# Patient Record
Sex: Male | Born: 1975 | Race: White | Hispanic: No | Marital: Married
Health system: Midwestern US, Community
[De-identification: ages and names within clinical notes are randomized; demographics above are authoritative.]

## PROBLEM LIST (undated history)

## (undated) DIAGNOSIS — E119 Type 2 diabetes mellitus without complications: Secondary | ICD-10-CM

## (undated) DIAGNOSIS — I10 Essential (primary) hypertension: Secondary | ICD-10-CM

## (undated) DIAGNOSIS — E1140 Type 2 diabetes mellitus with diabetic neuropathy, unspecified: Secondary | ICD-10-CM

## (undated) HISTORY — PX: WISDOM TOOTH EXTRACTION: SHX21

---

## 2014-02-18 ENCOUNTER — Encounter: Attending: Internal Medicine | Primary: Family Medicine

## 2014-10-02 LAB — AMB EXT LDL-C: LDL-C, External: 50.2

## 2015-05-04 ENCOUNTER — Ambulatory Visit
Admit: 2015-05-04 | Discharge: 2015-05-04 | Payer: PRIVATE HEALTH INSURANCE | Attending: Family Medicine | Primary: Family Medicine

## 2015-05-04 DIAGNOSIS — I10 Essential (primary) hypertension: Secondary | ICD-10-CM

## 2015-05-04 MED ORDER — CODEINE-GUAIFENESIN 10 MG-100 MG/5 ML ORAL LIQUID
100-10 mg/5 mL | Freq: Four times a day (QID) | ORAL | 0 refills | Status: DC | PRN
Start: 2015-05-04 — End: 2015-05-17

## 2015-05-04 MED ORDER — ALBUTEROL SULFATE HFA 90 MCG/ACTUATION AEROSOL INHALER
90 mcg/actuation | RESPIRATORY_TRACT | 1 refills | Status: DC | PRN
Start: 2015-05-04 — End: 2015-05-17

## 2015-05-04 NOTE — Progress Notes (Signed)
HISTORY OF PRESENT ILLNESS  Jeffrey StaplerKevin D Bradley is a 40 y.o. male.  HPI Comments: went to ER over the weekend, bp was high- they gave him one month supply lisinopril     Hypertension    The history is provided by the patient and medical records. This is a new problem. Progression since onset: BP in ER was 180/110, improved slightly since starting meds last weekend. Risk factors include smoking/tobacco exposure, obesity, male gender, hypertension and a sedentary lifestyle.   Blood sugar problem   The history is provided by the patient and medical records. Chronicity: noticed elevated glucose on lab from a year ago > 200. pt checked random sugar on his dads monitor and was 140.  The symptoms are aggravated by eating. The treatment provided no relief.   Cough   The history is provided by the patient. Chronicity: seen in ER over weekend and started on zithromax. The problem has been gradually improving. The symptoms are relieved by medications. The treatment provided moderate relief.   Wheezing    The history is provided by the patient. This is a recurrent problem. The problem occurs rarely. Associated symptoms include cough (getting over bronchitis. ). The problem's precipitants include smoke. He has tried nothing for the symptoms.       Review of Systems   Constitutional: Negative.    HENT: Negative.    Eyes: Negative.    Respiratory: Positive for cough (getting over bronchitis. ) and wheezing.    Cardiovascular: Negative.    Gastrointestinal: Negative.    Genitourinary: Negative.    Musculoskeletal: Negative.    Skin: Negative.    Neurological: Negative.    Psychiatric/Behavioral: Negative.      Vitals:    05/04/15 1132 05/04/15 1135   BP: (!) 158/92 (!) 160/94   Pulse: 73    Resp: 17    Temp: 97.6 ??F (36.4 ??C)    TempSrc: Oral    SpO2: 99%    Weight: 274 lb (124.3 kg)    Height: 5' 11.5" (1.816 m)    Body mass index is 37.68 kg/(m^2).     Physical Exam    Constitutional: He is oriented to person, place, and time. Vital signs are normal. He appears well-developed and well-nourished.   obese   HENT:   Head: Normocephalic and atraumatic.   Right Ear: Tympanic membrane, external ear and ear canal normal.   Left Ear: Tympanic membrane, external ear and ear canal normal.   Nose: Nose normal.   Mouth/Throat: Uvula is midline, oropharynx is clear and moist and mucous membranes are normal.   Eyes: Conjunctivae and lids are normal. Pupils are equal, round, and reactive to light.   Neck: Trachea normal and normal range of motion. Neck supple. Normal carotid pulses present. Carotid bruit is not present. No tracheal deviation present. No thyroid mass and no thyromegaly present.   Cardiovascular: Normal rate, regular rhythm and normal heart sounds.    Pulmonary/Chest: Effort normal and breath sounds normal. No respiratory distress.   Musculoskeletal: Normal range of motion.   Lymphadenopathy:        Head (right side): No submandibular adenopathy present.        Head (left side): No submandibular adenopathy present.     He has no cervical adenopathy.        Right: No supraclavicular adenopathy present.        Left: No supraclavicular adenopathy present.   Neurological: He is alert and oriented to person, place, and time. He  has normal strength.   Skin: Skin is warm and dry.   Psychiatric: He has a normal mood and affect. His speech is normal and behavior is normal. Judgment and thought content normal. Cognition and memory are normal.       ASSESSMENT and PLAN    ICD-10-CM ICD-9-CM    1. HTN (hypertension), benign I10 401.1 lisinopril (PRINIVIL, ZESTRIL) 20 mg tablet      LIPID PANEL      METABOLIC PANEL, COMPREHENSIVE      TSH 3RD GENERATION      CBC WITH AUTOMATED DIFF   2. Glucose intolerance (impaired glucose tolerance) R73.02 790.22 HEMOGLOBIN A1C WITH EAG   3. Nicotine dependence, cigarettes, uncomplicated F17.210 305.1 PR SMOKING AND TOBACCO USE CESSATION 3 - 10 MINUTES    4. BMI 37.0-37.9, adult Z68.37 V85.37    5. Bronchitis J40 490 azithromycin (ZITHROMAX) 250 mg tablet      guaiFENesin-codeine (ROBITUSSIN AC) 100-10 mg/5 mL solution   6. Wheezing R06.2 786.07 REFERRAL TO PULMONARY DISEASE      albuterol (PROAIR HFA) 90 mcg/actuation inhaler         1. HTN (hypertension), benign  Jeffrey Bradley has been given the following recommendations today due to his elevated BP reading: rescreen BP within a minimum of 2 weeks, lifestyle modifications to include: weight reduction, DASH eating plan, dietary sodium restriction and increase physical activity and anti-hypertensive pharmacologic therapy ordered.  Continue Lisinopril at current dose. F/u 1-2 weeks to recheck and review labs.     - lisinopril (PRINIVIL, ZESTRIL) 20 mg tablet; Take  by mouth daily.  - LIPID PANEL  - METABOLIC PANEL, COMPREHENSIVE  - TSH, 3RD GENERATION  - CBC WITH AUTOMATED DIFF    2. Glucose intolerance (impaired glucose tolerance)  Elevated blood sugar is indicative of glucose intolerance.  Patient is educated regarding prediabetes pathophysiology.  Sugar and carbohydrate restriction discussed.  Recommended lean protein emphasis in diet, with elimination of  sugared beverages including soda pop and juices.  Decreased carbs such as pasta, rice, cereal, bread, potatoes emphasized.  Whole wheat and multigrain to replace all processed flour.  Weight loss and exercise also emphasized as ways to significantly reduce blood sugars.  Check A1C and f/u.    - HEMOGLOBIN A1C    3. Nicotine dependence, cigarettes, uncomplicated  The patient was counseled on the dangers of tobacco use, and was advised to quit.  Reviewed strategies to maximize success, including removing cigarettes and smoking materials from environment, stress management, substitution of other forms of reinforcement and support of family/friends.  Recommended that patient consider quitting tobacco and set a quit date.  Various methods to help quit discussed.  Benefits of quitting discussed.   3-10 minutes counsellng.    4. BMI 37.0-37.9, adult  I have reviewed/discussed the above normal BMI with the patient.  I have recommended the following interventions: dietary management education, guidance, and counseling, encourage exercise and monitor weight . .      5. Bronchitis  Finish abx. Cough suppressant prn. F/u prn.     - azithromycin (ZITHROMAX) 250 mg tablet; Take 250 mg by mouth daily.  - guaiFENesin-codeine (ROBITUSSIN AC) 100-10 mg/5 mL solution; Take 5 mL by mouth four (4) times daily as needed for Cough. Max Daily Amount: 20 mL.  Dispense: 240 mL; Refill: 0    6. Wheezing  Needs w/u for COPD vs asthma. Refer for PFTs.     - PFT (test only) - [facility]  - albuterol (PROAIR HFA)  90 mcg/actuation inhaler; Take 1 Puff by inhalation every four (4) hours as needed for Wheezing or Shortness of Breath.  Dispense: 1 Inhaler; Refill: 1      Follow-up Disposition:  Return in about 2 weeks (around 05/18/2015) for lab review, HTN.    Leamon Arnt., M.D.

## 2015-05-05 LAB — METABOLIC PANEL, COMPREHENSIVE
A-G Ratio: 1.8 (ref 1.2–2.2)
ALT (SGPT): 53 IU/L — ABNORMAL HIGH (ref 0–44)
AST (SGOT): 37 IU/L (ref 0–40)
Albumin: 4.8 g/dL (ref 3.5–5.5)
Alk. phosphatase: 77 IU/L (ref 39–117)
BUN/Creatinine ratio: 9 (ref 9–20)
BUN: 11 mg/dL (ref 6–20)
Bilirubin, total: 0.5 mg/dL (ref 0.0–1.2)
CO2: 24 mmol/L (ref 18–29)
Calcium: 9.9 mg/dL (ref 8.7–10.2)
Chloride: 100 mmol/L (ref 96–106)
Creatinine: 1.21 mg/dL (ref 0.76–1.27)
GFR est AA: 88 mL/min/{1.73_m2} (ref >59)
GFR est non-AA: 76 mL/min/{1.73_m2} (ref >59)
GLOBULIN, TOTAL: 2.6 g/dL (ref 1.5–4.5)
Glucose: 125 mg/dL — ABNORMAL HIGH (ref 65–99)
Potassium: 4.8 mmol/L (ref 3.5–5.2)
Protein, total: 7.4 g/dL (ref 6.0–8.5)
Sodium: 143 mmol/L (ref 134–144)

## 2015-05-05 LAB — CBC WITH AUTOMATED DIFF
ABS. BASOPHILS: 0.1 10*3/uL (ref 0.0–0.2)
ABS. EOSINOPHILS: 0.2 10*3/uL (ref 0.0–0.4)
ABS. IMM. GRANS.: 0 10*3/uL (ref 0.0–0.1)
ABS. MONOCYTES: 0.9 10*3/uL (ref 0.1–0.9)
ABS. NEUTROPHILS: 5.6 10*3/uL (ref 1.4–7.0)
Abs Lymphocytes: 3.3 10*3/uL — ABNORMAL HIGH (ref 0.7–3.1)
BASOPHILS: 1 %
EOSINOPHILS: 2 %
HCT: 47.5 % (ref 37.5–51.0)
HGB: 16 g/dL (ref 12.6–17.7)
IMMATURE GRANULOCYTES: 0 %
Lymphocytes: 32 %
MCH: 29.8 pg (ref 26.6–33.0)
MCHC: 33.7 g/dL (ref 31.5–35.7)
MCV: 89 fL (ref 79–97)
MONOCYTES: 9 %
NEUTROPHILS: 56 %
PLATELET: 259 10*3/uL (ref 150–379)
RBC: 5.37 x10E6/uL (ref 4.14–5.80)
RDW: 14.1 % (ref 12.3–15.4)
WBC: 10.1 10*3/uL (ref 3.4–10.8)

## 2015-05-05 LAB — HEMOGLOBIN A1C WITH EAG
Estimated average glucose: 146 mg/dL
Hemoglobin A1c: 6.7 % — ABNORMAL HIGH (ref 4.8–5.6)

## 2015-05-05 LAB — LIPID PANEL
Cholesterol, total: 142 mg/dL (ref 100–199)
HDL Cholesterol: 25 mg/dL — ABNORMAL LOW (ref >39)
LDL, calculated: 72 mg/dL (ref 0–99)
Triglyceride: 223 mg/dL — ABNORMAL HIGH (ref 0–149)
VLDL, calculated: 45 mg/dL — ABNORMAL HIGH (ref 5–40)

## 2015-05-05 LAB — TSH 3RD GENERATION: TSH: 1.71 u[IU]/mL (ref 0.450–4.500)

## 2015-05-17 ENCOUNTER — Ambulatory Visit
Admit: 2015-05-17 | Discharge: 2015-05-17 | Payer: PRIVATE HEALTH INSURANCE | Attending: Family Medicine | Primary: Family Medicine

## 2015-05-17 DIAGNOSIS — E1165 Type 2 diabetes mellitus with hyperglycemia: Secondary | ICD-10-CM

## 2015-05-17 MED ORDER — BLOOD SUGAR DIAGNOSTIC TEST STRIPS
ORAL_STRIP | 11 refills | Status: AC
Start: 2015-05-17 — End: ?

## 2015-05-17 MED ORDER — LANCETS
11 refills | Status: AC
Start: 2015-05-17 — End: ?

## 2015-05-17 MED ORDER — METFORMIN 500 MG TAB
500 mg | ORAL_TABLET | Freq: Two times a day (BID) | ORAL | 5 refills | Status: AC
Start: 2015-05-17 — End: ?

## 2015-05-17 MED ORDER — LISINOPRIL-HYDROCHLOROTHIAZIDE 20 MG-12.5 MG TAB
ORAL_TABLET | Freq: Every day | ORAL | 5 refills | Status: AC
Start: 2015-05-17 — End: ?

## 2015-05-17 MED ORDER — BLOOD GLUCOSE METER KIT
PACK | 0 refills | Status: AC
Start: 2015-05-17 — End: ?

## 2015-05-17 MED ORDER — TERBINAFINE 250 MG TAB
250 mg | ORAL_TABLET | Freq: Every day | ORAL | 2 refills | Status: AC
Start: 2015-05-17 — End: ?

## 2015-05-17 NOTE — Progress Notes (Signed)
HISTORY OF PRESENT ILLNESS  KEYSHUN ELPERS is a 40 y.o. male.  HPI Comments: lab review    Hypertension    The history is provided by the patient and medical records. This is a chronic problem. The problem has not changed since onset.Risk factors include dyslipidemia, diabetes mellitus and hypertension.   Nicotine Dependence   The history is provided by the patient. This is a chronic problem. The problem has not changed since onset.Nothing relieves the symptoms. The treatment provided no relief.   Diabetes   The history is provided by the patient and medical records. This is a new problem. Progression since onset: new diagnosis. The symptoms are aggravated by eating. Nothing relieves the symptoms. He has tried nothing for the symptoms. The treatment provided no relief.   Cholesterol Problem   The history is provided by the patient and medical records. This is a chronic problem. Associated symptoms comments: Normal LDL, low HDL, mildly elevated TG. The symptoms are aggravated by eating. He has tried nothing for the symptoms. The treatment provided no relief.     ========================================    05/04/15 Dr. Lenna Sciara    1. HTN (hypertension), benign  Si has been given the following recommendations today due to his elevated BP reading: rescreen BP within a minimum of 2 weeks, lifestyle modifications to include: weight reduction, DASH eating plan, dietary sodium restriction and increase physical activity and anti-hypertensive pharmacologic therapy ordered.  Continue Lisinopril at current dose. F/u 1-2 weeks to recheck and review labs.   ??  - lisinopril (PRINIVIL, ZESTRIL) 20 mg tablet; Take by mouth daily.  - LIPID PANEL  - METABOLIC PANEL, COMPREHENSIVE  - TSH, 3RD GENERATION  - CBC WITH AUTOMATED DIFF  ??  2. Glucose intolerance (impaired glucose tolerance)  Elevated blood sugar is indicative of glucose intolerance. Patient is educated regarding prediabetes pathophysiology. Sugar and carbohydrate  restriction discussed. Recommended lean protein emphasis in diet, with elimination of sugared beverages including soda pop and juices. Decreased carbs such as pasta, rice, cereal, bread, potatoes emphasized. Whole wheat and multigrain to replace all processed flour. Weight loss and exercise also emphasized as ways to significantly reduce blood sugars. Check A1C and f/u.  ??  - HEMOGLOBIN A1C  ??  3. Nicotine dependence, cigarettes, uncomplicated  The patient was counseled on the dangers of tobacco use, and was advised to quit. Reviewed strategies to maximize success, including removing cigarettes and smoking materials from environment, stress management, substitution of other forms of reinforcement and support of family/friends.  Recommended that patient consider quitting tobacco and set a quit date. Various methods to help quit discussed. Benefits of quitting discussed.   3-10 minutes counsellng.  ??  4. BMI 37.0-37.9, adult  I have reviewed/discussed the above normal BMI with the patient. I have recommended the following interventions: dietary management education, guidance, and counseling, encourage exercise and monitor weight . Marland Kitchen   ??  5. Bronchitis  Finish abx. Cough suppressant prn. F/u prn.   ??  - azithromycin (ZITHROMAX) 250 mg tablet; Take 250 mg by mouth daily.  - guaiFENesin-codeine (ROBITUSSIN AC) 100-10 mg/5 mL solution; Take 5 mL by mouth four (4) times daily as needed for Cough. Max Daily Amount: 20 mL. Dispense: 240 mL; Refill: 0  ??  6. Wheezing  Needs w/u for COPD vs asthma. Refer for PFTs.    - PFT (test only) - [facility]  - albuterol (PROAIR HFA) 90 mcg/actuation inhaler; Take 1 Puff by inhalation every four (4) hours as  needed for Wheezing or Shortness of Breath. Dispense: 1 Inhaler; Refill: 1  ??  ??  Follow-up Disposition:  Return in about 2 weeks (around 05/18/2015) for lab review, HTN.    ==========================================================  Review of Systems    Constitutional: Negative.    HENT: Negative.    Eyes: Negative.    Respiratory: Negative.    Cardiovascular: Negative.    Gastrointestinal: Negative.    Genitourinary: Negative.    Musculoskeletal: Negative.    Skin: Negative.         Fungal toenails     Neurological: Negative.    Psychiatric/Behavioral: Negative.        Visit Vitals   ??? BP 144/90 (BP 1 Location: Left arm)   ??? Pulse 79   ??? Temp 97.9 ??F (36.6 ??C) (Oral)   ??? Resp 16   ??? Ht 5' 11.5" (1.816 m)   ??? Wt 244 lb (110.7 kg)   ??? SpO2 98%   ??? BMI 33.56 kg/m2       Physical Exam   Constitutional: He is oriented to person, place, and time. Vital signs are normal. He appears well-developed and well-nourished. No distress.   obese   HENT:   Head: Normocephalic and atraumatic.   Right Ear: External ear normal.   Left Ear: External ear normal.   Nose: Nose normal.   Eyes: Conjunctivae and lids are normal.   Neck: Trachea normal and normal range of motion. Neck supple. No thyromegaly present.   Cardiovascular: Normal rate and regular rhythm.    Pulmonary/Chest: Effort normal. No respiratory distress.   Musculoskeletal: Normal range of motion.   Neurological: He is alert and oriented to person, place, and time. He has normal strength. Gait normal.   Monofilament testing bilateral feet:normal sensation soles of feet. Mild dry skin and callus bilateral soles. Several nails are discolored, overgrown, and thickened.        Skin: Skin is warm and dry.   Psychiatric: He has a normal mood and affect. His behavior is normal. Judgment and thought content normal.     Component      Latest Ref Rng & Units 05/04/2015          12:44 PM   WBC      3.4 - 10.8 x10E3/uL 10.1   RBC      4.14 - 5.80 x10E6/uL 5.37   HGB      12.6 - 17.7 g/dL 16.0   HCT      37.5 - 51.0 % 47.5   MCV      79 - 97 fL 89   MCH      26.6 - 33.0 pg 29.8   MCHC      31.5 - 35.7 g/dL 33.7   RDW      12.3 - 15.4 % 14.1   PLATELET      150 - 379 x10E3/uL 259   NEUTROPHILS      % 56   Lymphocytes      % 32    MONOCYTES      % 9   EOSINOPHILS      % 2   BASOPHILS      % 1     Component      Latest Ref Rng & Units 05/04/2015          12:44 PM   Glucose      65 - 99 mg/dL 125 (H)   BUN      6 - 20  mg/dL 11   Creatinine      0.76 - 1.27 mg/dL 1.21   GFR est non-AA      >59 mL/min/1.73 76   GFR est AA      >59 mL/min/1.73 88   BUN/Creatinine ratio      9 - 20 9   Sodium      134 - 144 mmol/L 143   Potassium      3.5 - 5.2 mmol/L 4.8   Chloride      96 - 106 mmol/L 100   CO2      18 - 29 mmol/L 24   Calcium      8.7 - 10.2 mg/dL 9.9   Protein, total      6.0 - 8.5 g/dL 7.4   Albumin      3.5 - 5.5 g/dL 4.8   GLOBULIN, TOTAL      1.5 - 4.5 g/dL 2.6   A-G Ratio      1.2 - 2.2 1.8   Bilirubin, total      0.0 - 1.2 mg/dL 0.5   Alk. phosphatase      39 - 117 IU/L 77   AST      0 - 40 IU/L 37   ALT      0 - 44 IU/L 53 (H)     Component      Latest Ref Rng & Units 05/04/2015          12:44 PM   Cholesterol, total      100 - 199 mg/dL 142   Triglyceride      0 - 149 mg/dL 223 (H)   HDL Cholesterol      >39 mg/dL 25 (L)   VLDL, calculated      5 - 40 mg/dL 45 (H)   LDL, calculated      0 - 99 mg/dL 72     Component      Latest Ref Rng & Units 05/04/2015          12:44 PM   Hemoglobin A1c, (calculated)      4.8 - 5.6 % 6.7 (H)   Estimated average glucose      mg/dL 146     Component      Latest Ref Rng & Units 05/04/2015          12:44 PM   TSH      0.450 - 4.500 uIU/mL 1.710       ASSESSMENT and PLAN    ICD-10-CM ICD-9-CM    1. Type 2 diabetes mellitus with hyperglycemia, without long-term current use of insulin (HCC) E11.65 250.00 metFORMIN (GLUCOPHAGE) 500 mg tablet     790.29 Blood-Glucose Meter monitoring kit      Lancets (ACCU-CHEK SOFTCLIX LANCETS) misc      glucose blood VI test strips (ACCU-CHEK AVIVA PLUS TEST STRP) strip      REFERRAL TO DIABETIC EDUCATION      HEMOGLOBIN A1C WITH EAG      MICROALBUMIN, UR, RAND W/ MICROALBUMIN/CREA RATIO   2. HTN (hypertension), benign I10 401.1 lisinopril-hydroCHLOROthiazide  (PRINZIDE, ZESTORETIC) 20-12.5 mg per tablet      METABOLIC PANEL, COMPREHENSIVE   3. Mixed hyperlipidemia E78.2 272.2 LIPID PANEL   4. BMI 37.0-37.9, adult Z68.37 V85.37    5. Nicotine dependence, cigarettes, uncomplicated O13.086 578.4 PR SMOKING AND TOBACCO USE CESSATION 3 - 10 MINUTES   6. Onychomycosis B35.1 110.1 terbinafine HCl (LAMISIL) 250 mg tablet  1. Type 2 diabetes mellitus with hyperglycemia, without long-term current use of insulin (HCC)  New dx DM2. Basic discussion of diet, lifestyle, weight loss, exercise for diabetes.  Specifically recommended a low carbohydrate diet with a mean protein emphasized.  For every 10 pounds patient's lose, and for every 30 min. Of daily exercise, diabetes management can be significantly improved with decreasing need for medication. Will refer to diabetes education. Diabetes testing supplies ordered. Medication will be started: Metformin. Discussed indications, benefits, risks, side effects, and alternatives to medication. F/u 3 mo.    - metFORMIN (GLUCOPHAGE) 500 mg tablet; Take 1 Tab by mouth two (2) times daily (with meals). Indications: type 2 diabetes mellitus  Dispense: 31 Tab; Refill: 5  - Blood-Glucose Meter monitoring kit; Accucheck. Use to check glucose. Check blood sugars BID. E11.9  Dispense: 1 Kit; Refill: 0  - Lancets (ACCU-CHEK SOFTCLIX LANCETS) misc; Check blood sugars BID. E11.9  Dispense: 1 Each; Refill: 11  - glucose blood VI test strips (ACCU-CHEK AVIVA PLUS TEST STRP) strip; Check blood sugars BID. E11.9  Dispense: 100 Strip; Refill: 11  - REFERRAL TO DIABETIC EDUCATION  - HEMOGLOBIN A1C; Future  - MICROALBUMIN, UR, RAND W/ MICROALBUMIN/CREA RATIO; Future    2. HTN (hypertension), benign  Joles has been given the following recommendations today due to his elevated BP reading: rescreen BP within a minimum of 3 months and lifestyle modifications to include: weight reduction, DASH eating plan,  dietary sodium restriction and increase physical activity.  Add HCTZ to prinivil, change to Prinzide. Recheck BP 3 mo.    - lisinopril-hydroCHLOROthiazide (PRINZIDE, ZESTORETIC) 20-12.5 mg per tablet; Take 1 Tab by mouth daily.  Dispense: 30 Tab; Refill: 5  - METABOLIC PANEL, COMPREHENSIVE; Future    3. Mixed hyperlipidemia  HDL low and TG elevated. LDL normal. Recheck 3 mo.     - LIPID PANEL; Future    4. BMI 37.0-37.9, adult  I have reviewed/discussed the above normal BMI with the patient.  I have recommended the following interventions: dietary management education, guidance, and counseling, encourage exercise and monitor weight . .      5. Nicotine dependence, cigarettes, uncomplicated  Recommended that patient consider quitting tobacco and set a quit date. Various methods to help quit discussed.  Benefits of quitting discussed.   3-10 minutes counsellng.  The patient was counseled on the dangers of tobacco use, and was advised to quit.  Reviewed strategies to maximize success, including removing cigarettes and smoking materials from environment and substitution of other forms of reinforcement.    - PR SMOKING AND TOBACCO USE CESSATION 3 - 10 MINUTES    6. Onychomycosis  Onychomycosis is a fungus that grows in the nail bed.  It does not respond to topical medications.  He can only be cured by 3 months of an oral antifungal.  Risks of this medication including liver toxicity.  Therefore liver enzymes are measured prior to starting and at end of taking this medication.  Care rates are approximately 80% at one year, with recurrence of 20% over the next 2-3 years.  Patient should not expect to see normal nails for 9-12 months after initiating therapy.  After considering this information the patient chooses to treat with Lamisil.    - terbinafine HCl (LAMISIL) 250 mg tablet; Take 1 Tab by mouth daily. 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    Eileen Stanford., M.D.

## 2015-05-17 NOTE — Patient Instructions (Signed)
Diabetes Foot Health: Care Instructions  Your Care Instructions    When you have diabetes, your feet need extra care and attention. Diabetes can damage the nerve endings and blood vessels in your feet, making you less likely to notice when your feet are injured. Diabetes also limits your body's ability to fight infection and get blood to areas that need it. If you get a minor foot injury, it could become an ulcer or a serious infection. With good foot care, you can prevent most of these problems.  Caring for your feet can be quick and easy. Most of the care can be done when you are bathing or getting ready for bed.  Follow-up care is a key part of your treatment and safety. Be sure to make and go to all appointments, and call your doctor if you are having problems. It???s also a good idea to know your test results and keep a list of the medicines you take.  How can you care for yourself at home?  ?? Keep your blood sugar close to normal by watching what and how much you eat, monitoring blood sugar, taking medicines if prescribed, and getting regular exercise.  ?? Do not smoke. Smoking affects blood flow and can make foot problems worse. If you need help quitting, talk to your doctor about stop-smoking programs and medicines. These can increase your chances of quitting for good.  ?? Eat a diet that is low in fats. High fat intake can cause fat to build up in your blood vessels and decrease blood flow.  ?? Inspect your feet daily for blisters, cuts, cracks, or sores. If you cannot see well, use a mirror or have someone help you.  ?? Take care of your feet:  ?? Wash your feet every day. Use warm (not hot) water. Check the water temperature with your wrists or other part of your body, not your feet.  ?? Dry your feet well. Pat them dry. Do not rub the skin on your feet too hard. Dry well between your toes. If the skin on your feet stays moist, bacteria or a fungus can grow, which can lead to infection.   ?? Keep your skin soft. Use moisturizing skin cream to keep the skin on your feet soft and prevent calluses and cracks. But do not put the cream between your toes, and stop using any cream that causes a rash.  ?? Clean underneath your toenails carefully. Do not use a sharp object to clean underneath your toenails. Use the blunt end of a nail file or other rounded tool.  ?? Trim and file your toenails straight across to prevent ingrown toenails. Use a nail clipper, not scissors. Use an emery board to smooth the edges.  ?? Change socks daily. Socks without seams are best, because seams often rub the feet. You can find socks for people with diabetes from specialty catalogs.  ?? Look inside your shoes every day for things like gravel or torn linings, which could cause blisters or sores.  ?? Buy shoes that fit well:  ?? Look for shoes that have plenty of space around the toes. This helps prevent bunions and blisters.  ?? Try on shoes while wearing the kind of socks you will usually wear with the shoes.  ?? Avoid plastic shoes. They may rub your feet and cause blisters. Good shoes should be made of materials that are flexible and breathable, such as leather or cloth.  ?? Break in new shoes slowly by wearing   them for no more than an hour a day for several days. Take extra time to check your feet for red areas, blisters, or other problems after you wear new shoes.  ?? Do not go barefoot. Do not wear sandals, and do not wear shoes with very thin soles. Thin soles are easy to puncture. They also do not protect your feet from hot pavement or cold weather.  ?? Have your doctor check your feet during each visit. If you have a foot problem, see your doctor. Do not try to treat an early foot problem at home. Home remedies or treatments that you can buy without a prescription (such as corn removers) can be harmful.  ?? Always get early treatment for foot problems. A minor irritation can lead to a major problem if not properly cared for early.   When should you call for help?  Call your doctor now or seek immediate medical care if:  ?? You have a foot sore, an ulcer or break in the skin that is not healing after 4 days, bleeding corns or calluses, or an ingrown toenail.  ?? You have blue or black areas, which can mean bruising or blood flow problems.  ?? You have peeling skin or tiny blisters between your toes or cracking or oozing of the skin.  ?? You have a fever for more than 24 hours and a foot sore.  ?? You have new numbness or tingling in your feet that does not go away after you move your feet or change positions.  ?? You have unexplained or unusual swelling of the foot or ankle.  Watch closely for changes in your health, and be sure to contact your doctor if:  ?? You cannot do proper foot care.  Where can you learn more?  Go to http://www.healthwise.net/[employer].  Enter A739 in the search box to learn more about "Diabetes Foot Health: Care Instructions."  Current as of: May 25, 2014  Content Version: 11.2  ?? 2006-2017 [employer]. Care instructions adapted under license by [employer] (which disclaims liability or warranty for this information). If you have questions about a medical condition or this instruction, always ask your healthcare professional. [employer] disclaims any warranty or liability for your use of this information.       Learning About Diabetes Food Guidelines  Your Care Instructions  Meal planning is important to manage diabetes. It helps keep your blood sugar at a target level (which you set with your doctor). You don't have to eat special foods. You can eat what your family eats, including sweets once in a while. But you do have to pay attention to how often you eat and how much you eat of certain foods.  You may want to work with a dietitian or a certified diabetes educator (CDE) to help you plan meals and snacks. A dietitian or CDE can also help  you lose weight if that is one of your goals.  What should you know about eating carbs?  Managing the amount of carbohydrate (carbs) you eat is an important part of healthy meals when you have diabetes. Carbohydrate is found in many foods.  ?? Learn which foods have carbs. And learn the amounts of carbs in different foods.  ?? Bread, cereal, pasta, and rice have about 15 grams of carbs in a serving. A serving is 1 slice of bread (1 ounce), ?? cup of cooked cereal, or 1/3 cup of cooked pasta or rice.  ?? Fruits have   15 grams of carbs in a serving. A serving is 1 small fresh fruit, such as an apple or orange; ?? of a banana; ?? cup of cooked or canned fruit; ?? cup of fruit juice; 1 cup of melon or raspberries; or 2 tablespoons of dried fruit.  ?? Milk and no-sugar-added yogurt have 15 grams of carbs in a serving. A serving is 1 cup of milk or 2/3 cup of no-sugar-added yogurt.  ?? Starchy vegetables have 15 grams of carbs in a serving. A serving is ?? cup of mashed potatoes or sweet potato; 1 cup winter squash; ?? of a small baked potato; ?? cup of cooked beans; or ?? cup cooked corn or green peas.  ?? Learn how much carbs to eat each day and at each meal. A dietitian or CDE can teach you how to keep track of the amount of carbs you eat. This is called carbohydrate counting.  ?? If you are not sure how to count carbohydrate grams, use the Plate Method to plan meals. It is a good, quick way to make sure that you have a balanced meal. It also helps you spread carbs throughout the day.  ?? Divide your plate by types of foods. Put non-starchy vegetables on half the plate, meat or other protein food on one-quarter of the plate, and a grain or starchy vegetable in the final quarter of the plate. To this you can add a small piece of fruit and 1 cup of milk or yogurt, depending on how many carbs you are supposed to eat at a meal.  ?? Try to eat about the same amount of carbs at each meal. Do not "save up"  your daily allowance of carbs to eat at one meal.  ?? Proteins have very little or no carbs per serving. Examples of proteins are beef, chicken, turkey, fish, eggs, tofu, cheese, cottage cheese, and peanut butter. A serving size of meat is 3 ounces, which is about the size of a deck of cards. Examples of meat substitute serving sizes (equal to 1 ounce of meat) are 1/4 cup of cottage cheese, 1 egg, 1 tablespoon of peanut butter, and ?? cup of tofu.  How can you eat out and still eat healthy?  ?? Learn to estimate the serving sizes of foods that have carbohydrate. If you measure food at home, it will be easier to estimate the amount in a serving of restaurant food.  ?? If the meal you order has too much carbohydrate (such as potatoes, corn, or baked beans), ask to have a low-carbohydrate food instead. Ask for a salad or green vegetables.  ?? If you use insulin, check your blood sugar before and after eating out to help you plan how much to eat in the future.  ?? If you eat more carbohydrate at a meal than you had planned, take a walk or do other exercise. This will help lower your blood sugar.  What else should you know?  ?? Limit saturated fat, such as the fat from meat and dairy products. This is a healthy choice because people who have diabetes are at higher risk of heart disease. So choose lean cuts of meat and nonfat or low-fat dairy products. Use olive or canola oil instead of butter or shortening when cooking.  ?? Don't skip meals. Your blood sugar may drop too low if you skip meals and take insulin or certain medicines for diabetes.  ?? Check with your doctor before you drink alcohol. Alcohol can   cause your blood sugar to drop too low. Alcohol can also cause a bad reaction if you take certain diabetes medicines.  Follow-up care is a key part of your treatment and safety. Be sure to make and go to all appointments, and call your doctor if you are having  problems. It's also a good idea to know your test results and keep a list of the medicines you take.  Where can you learn more?  Go to http://www.healthwise.net/[facility]HelpConnections.  Enter I147 in the search box to learn more about "Learning About Diabetes Food Guidelines."  Current as of: May 25, 2014  Content Version: 11.2  ?? 2006-2017 [employer]. Care instructions adapted under license by [employer] (which disclaims liability or warranty for this information). If you have questions about a medical condition or this instruction, always ask your healthcare professional. [employer] disclaims any warranty or liability for your use of this information.

## 2015-05-18 ENCOUNTER — Encounter: Attending: Family Medicine | Primary: Family Medicine

## 2015-06-01 NOTE — Progress Notes (Signed)
Letter out after 3 phone attempts. Rudie MeyerJulie Rimas Gilham, RRT

## 2015-08-19 ENCOUNTER — Encounter: Payer: PRIVATE HEALTH INSURANCE | Primary: Family Medicine

## 2015-08-25 ENCOUNTER — Ambulatory Visit: Attending: Family Medicine | Primary: Family Medicine

## 2015-08-25 NOTE — Progress Notes (Signed)
HISTORY OF PRESENT ILLNESS  Jeffrey Bradley is a 40 y.o. male.  Diabetes     Hypertension      Cholesterol Problem       Labs not done    ============================================    05/07/15 Dr. Lenna Sciara    1. Type 2 diabetes mellitus with hyperglycemia, without long-term current use of insulin (HCC)  New dx DM2. Basic discussion of diet, lifestyle, weight loss, exercise for diabetes.  Specifically recommended a low carbohydrate diet with a mean protein emphasized.  For every 10 pounds patient's lose, and for every 30 min. Of daily exercise, diabetes management can be significantly improved with decreasing need for medication. Will refer to diabetes education. Diabetes testing supplies ordered. Medication will be started: Metformin. Discussed indications, benefits, risks, side effects, and alternatives to medication. F/u 3 mo.  ??  - metFORMIN (GLUCOPHAGE) 500 mg tablet; Take 1 Tab by mouth two (2) times daily (with meals). Indications: type 2 diabetes mellitus  Dispense: 81 Tab; Refill: 5  - Blood-Glucose Meter monitoring kit; Accucheck. Use to check glucose. Check blood sugars BID. E11.9  Dispense: 1 Kit; Refill: 0  - Lancets (ACCU-CHEK SOFTCLIX LANCETS) misc; Check blood sugars BID. E11.9  Dispense: 1 Each; Refill: 11  - glucose blood VI test strips (ACCU-CHEK AVIVA PLUS TEST STRP) strip; Check blood sugars BID. E11.9  Dispense: 100 Strip; Refill: 11  - REFERRAL TO DIABETIC EDUCATION  - HEMOGLOBIN A1C; Future  - MICROALBUMIN, UR, RAND W/ MICROALBUMIN/CREA RATIO; Future  ??  2. HTN (hypertension), benign  [name] has been given the following recommendations today due to his elevated BP reading: rescreen BP within a minimum of 3 months and lifestyle modifications to include: weight reduction, DASH eating plan, dietary sodium restriction and increase physical activity.  Add HCTZ to prinivil, change to Prinzide. Recheck BP 3 mo.  ??  - lisinopril-hydroCHLOROthiazide (PRINZIDE, ZESTORETIC) 20-12.5 mg per  tablet; Take 1 Tab by mouth daily.  Dispense: 30 Tab; Refill: 5  - METABOLIC PANEL, COMPREHENSIVE; Future  ??  3. Mixed hyperlipidemia  HDL low and TG elevated. LDL normal. Recheck 3 mo.   ??  - LIPID PANEL; Future  ??  4. BMI 37.0-37.9, adult  I have reviewed/discussed the above normal BMI with the patient.  I have recommended the following interventions: dietary management education, guidance, and counseling, encourage exercise and monitor weight . Marland Kitchen    ??  5. Nicotine dependence, cigarettes, uncomplicated  Recommended that patient consider quitting tobacco and set a quit date. Various methods to help quit discussed.  Benefits of quitting discussed.   3-10 minutes counsellng.  The patient was counseled on the dangers of tobacco use, and was advised to quit.  Reviewed strategies to maximize success, including removing cigarettes and smoking materials from environment and substitution of other forms of reinforcement.  ??  - PR SMOKING AND TOBACCO USE CESSATION 3 - 10 MINUTES  ??  6. Onychomycosis  Onychomycosis is a fungus that grows in the nail bed.  It does not respond to topical medications.  He can only be cured by 3 months of an oral antifungal.  Risks of this medication including liver toxicity.  Therefore liver enzymes are measured prior to starting and at end of taking this medication.  Care rates are approximately 80% at one year, with recurrence of 20% over the next 2-3 years.  Patient should not expect to see normal nails for 9-12 months after initiating therapy.  After considering this information the patient chooses  to treat with Lamisil.  ??  - terbinafine HCl (LAMISIL) 250 mg tablet; Take 1 Tab by mouth daily. 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  ??  Follow-up Disposition:  Return in about 3 months (around 08/17/2015) for diabetes, HTN, lipids.    ==================================================================  ROS    Physical Exam   Component       Latest Ref Rng & Units 05/04/2015 05/04/2015          12:44 PM 12:44 PM   Hemoglobin A1c, (calculated)      4.8 - 5.6 % 6.7 (H)    Estimated average glucose      mg/dL 146    TSH      0.450 - 4.500 uIU/mL  1.710     Component      Latest Ref Rng & Units 05/04/2015          12:44 PM   Cholesterol, total      100 - 199 mg/dL 142   Triglyceride      0 - 149 mg/dL 223 (H)   HDL Cholesterol      >39 mg/dL 25 (L)   VLDL, calculated      5 - 40 mg/dL 45 (H)   LDL, calculated      0 - 99 mg/dL 72     Component      Latest Ref Rng & Units 05/04/2015          12:44 PM   Glucose      65 - 99 mg/dL 125 (H)   BUN      6 - 20 mg/dL 11   Creatinine      0.76 - 1.27 mg/dL 1.21   GFR est non-AA      >59 mL/min/1.73 76   GFR est AA      >59 mL/min/1.73 88   BUN/Creatinine ratio      9 - 20 9   Sodium      134 - 144 mmol/L 143   Potassium      3.5 - 5.2 mmol/L 4.8   Chloride      96 - 106 mmol/L 100   CO2      18 - 29 mmol/L 24   Calcium      8.7 - 10.2 mg/dL 9.9   Protein, total      6.0 - 8.5 g/dL 7.4   Albumin      3.5 - 5.5 g/dL 4.8   GLOBULIN, TOTAL      1.5 - 4.5 g/dL 2.6   A-G Ratio      1.2 - 2.2 1.8   Bilirubin, total      0.0 - 1.2 mg/dL 0.5   Alk. phosphatase      39 - 117 IU/L 77   AST      0 - 40 IU/L 37   ALT (SGPT)      0 - 44 IU/L 53 (H)     Component      Latest Ref Rng & Units 05/04/2015          12:44 PM   WBC      3.4 - 10.8 x10E3/uL 10.1   RBC      4.14 - 5.80 x10E6/uL 5.37   HGB      12.6 - 17.7 g/dL 16.0   HCT      37.5 - 51.0 % 47.5   MCV      79 -  97 fL 89   MCH      26.6 - 33.0 pg 29.8   MCHC      31.5 - 35.7 g/dL 33.7   RDW      12.3 - 15.4 % 14.1   PLATELET      150 - 379 x10E3/uL 259   NEUTROPHILS      % 56   Lymphocytes      % 32   MONOCYTES      % 9   EOSINOPHILS      % 2   BASOPHILS      % 1     ASSESSMENT and PLAN    ICD-10-CM ICD-9-CM    1. No-show for appointment Z53.29 V64.2

## 2021-03-16 ENCOUNTER — Encounter: Payer: Self-pay | Admitting: Emergency Medicine

## 2021-03-16 ENCOUNTER — Other Ambulatory Visit: Payer: Self-pay

## 2021-03-16 ENCOUNTER — Emergency Department
Admission: EM | Admit: 2021-03-16 | Discharge: 2021-03-17 | Disposition: A | Discharge status: Home / Self Care | Payer: BC Managed Care – PPO | Attending: Emergency Medicine | Admitting: Emergency Medicine

## 2021-03-16 ENCOUNTER — Emergency Department: Payer: BC Managed Care – PPO

## 2021-03-16 DIAGNOSIS — R519 Headache, unspecified: Secondary | ICD-10-CM | POA: Insufficient documentation

## 2021-03-16 DIAGNOSIS — I10 Essential (primary) hypertension: Secondary | ICD-10-CM | POA: Insufficient documentation

## 2021-03-16 DIAGNOSIS — K0889 Other specified disorders of teeth and supporting structures: Secondary | ICD-10-CM | POA: Diagnosis not present

## 2021-03-16 DIAGNOSIS — J019 Acute sinusitis, unspecified: Secondary | ICD-10-CM | POA: Insufficient documentation

## 2021-03-16 DIAGNOSIS — E1140 Type 2 diabetes mellitus with diabetic neuropathy, unspecified: Secondary | ICD-10-CM | POA: Diagnosis not present

## 2021-03-16 DIAGNOSIS — J0100 Acute maxillary sinusitis, unspecified: Secondary | ICD-10-CM | POA: Insufficient documentation

## 2021-03-16 HISTORY — DX: Type 2 diabetes mellitus with diabetic neuropathy, unspecified: E11.40

## 2021-03-16 HISTORY — DX: Essential (primary) hypertension: I10

## 2021-03-16 HISTORY — DX: Type 2 diabetes mellitus without complications: E11.9

## 2021-03-16 IMAGING — CT CT MAXILLOFACIAL W/O CM
3 series · 16 of 47 positions shown, 19 images · non-contrast
Comparison: None.

CLINICAL DATA: Headache, facial pain

EXAM:
CT HEAD WITHOUT CONTRAST
CT MAXILLOFACIAL WITHOUT CONTRAST
TECHNIQUE: Multidetector CT imaging of the head and maxillofacial structures
were performed using the standard protocol without intravenous
contrast. Multiplanar CT image reconstructions of the maxillofacial
structures were also generated.
RADIATION DOSE REDUCTION: This exam was performed according to the
departmental dose-optimization program which includes automated
exposure control, adjustment of the mA and/or kV according to
patient size and/or use of iterative reconstruction technique.

[Series 2: max soft · axial · 0.39mm/px · z∈[-211,-65]mm · 10 of 85 slices shown, 13 images]
[im 6/85  brain]
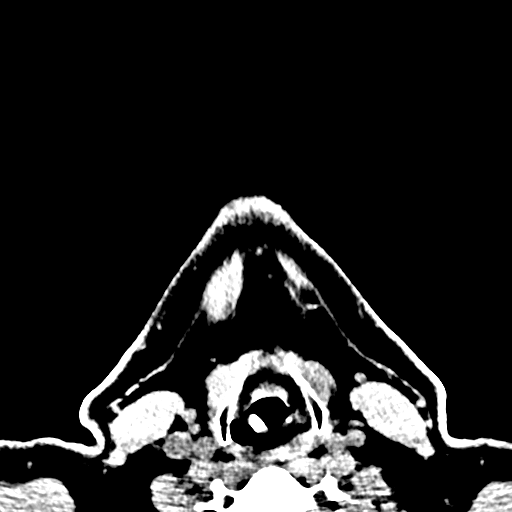
[im 6/85  bone]
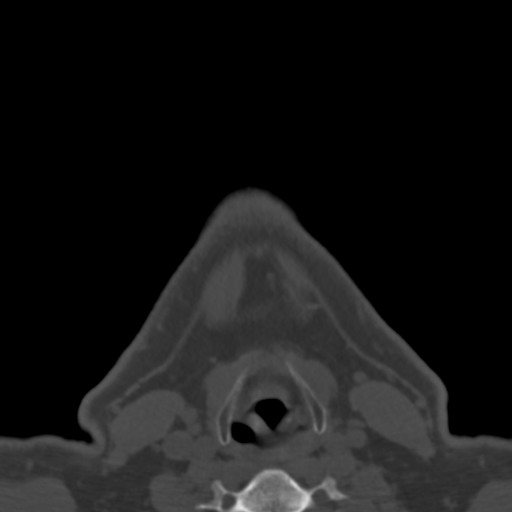
[im 15/85  bone]
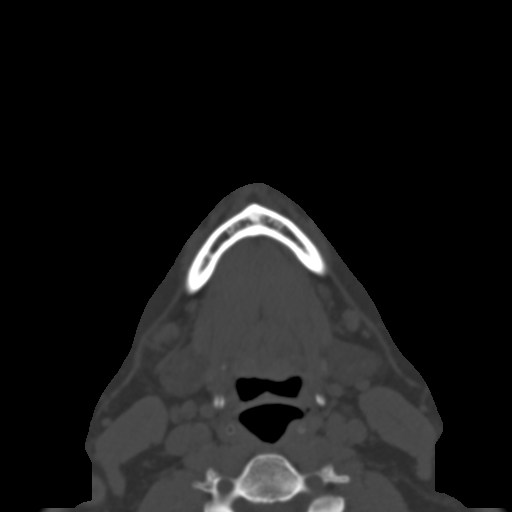
[im 24/85  bone]
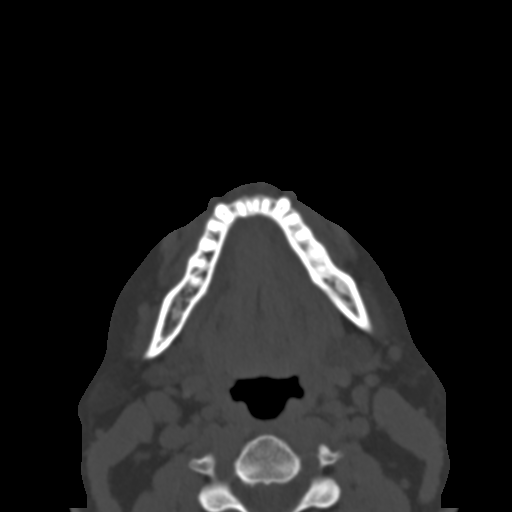
[im 29/85  bone]
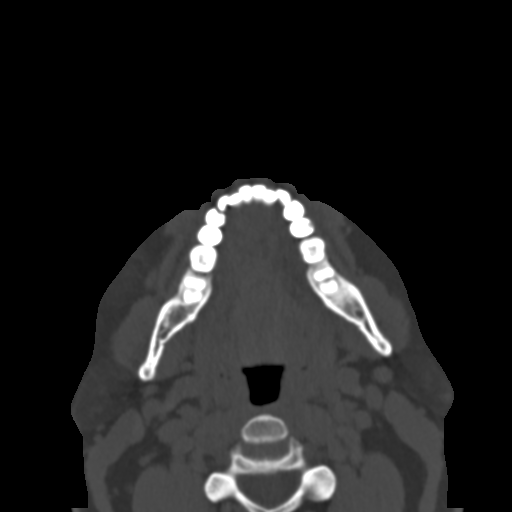
[im 38/85  brain]
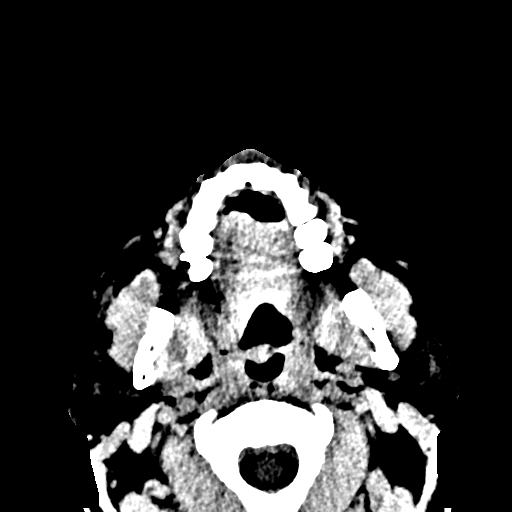
[im 38/85  bone]
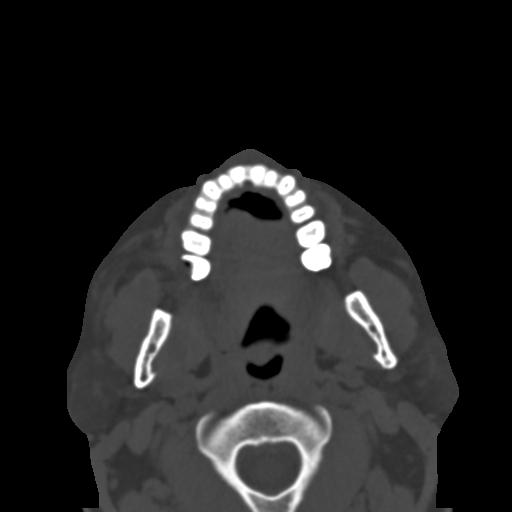
[im 47/85  bone]
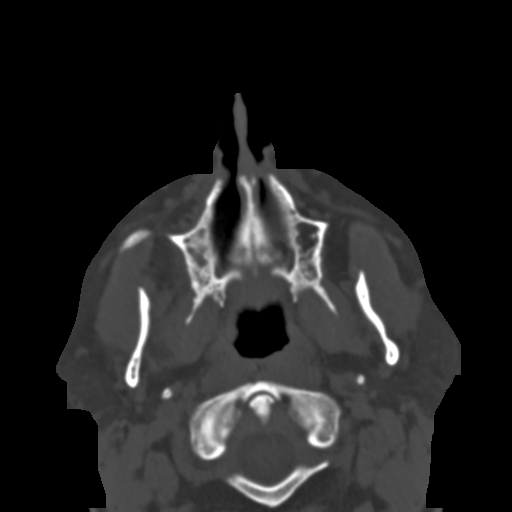
[im 56/85  bone]
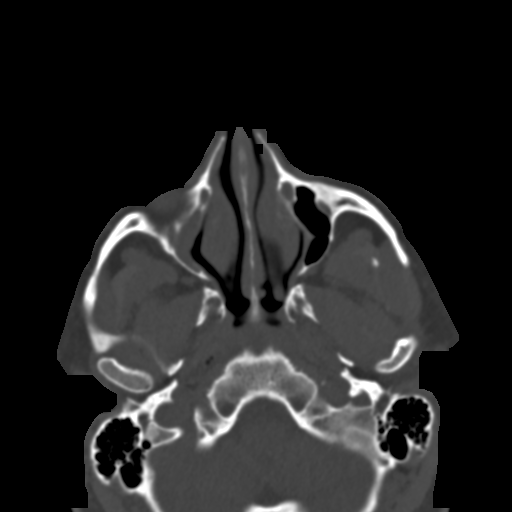
[im 64/85  bone]
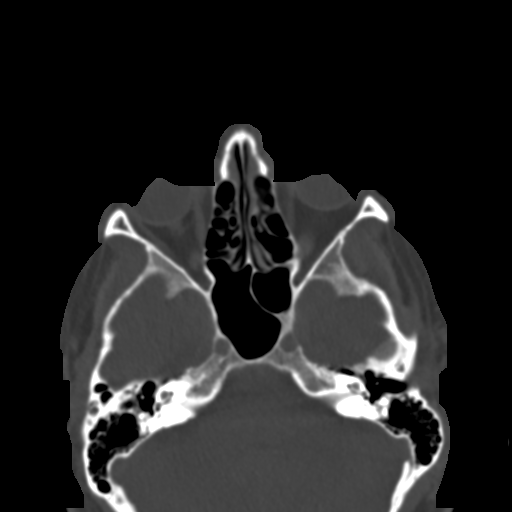
[im 70/85  brain]
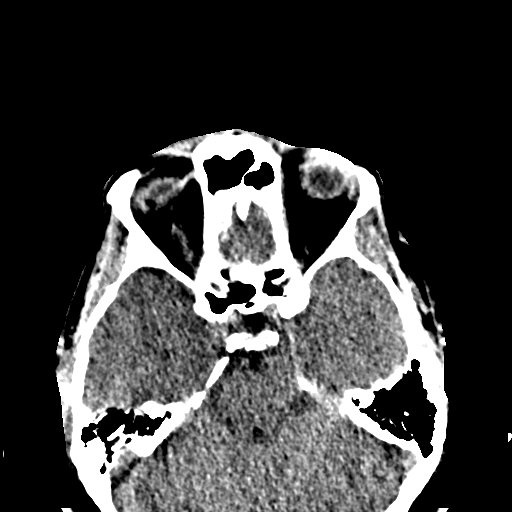
[im 70/85  bone]
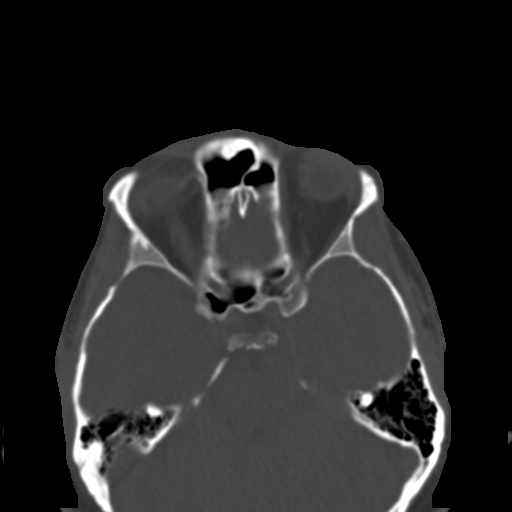
[im 79/85  bone]
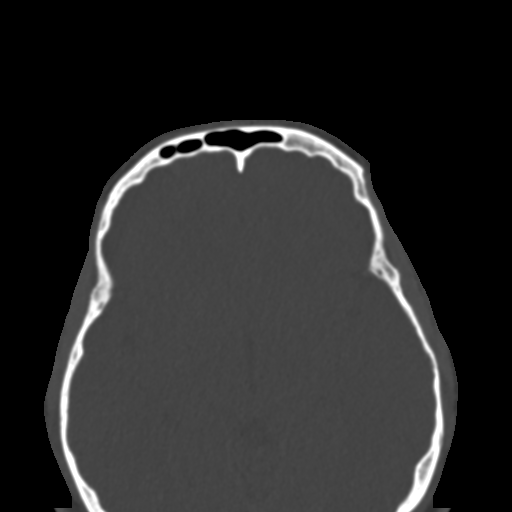

[Series 6: coronal soft · coronal · 0.39mm/px · 3 of 83 slices shown]
[im 37/83  bone]
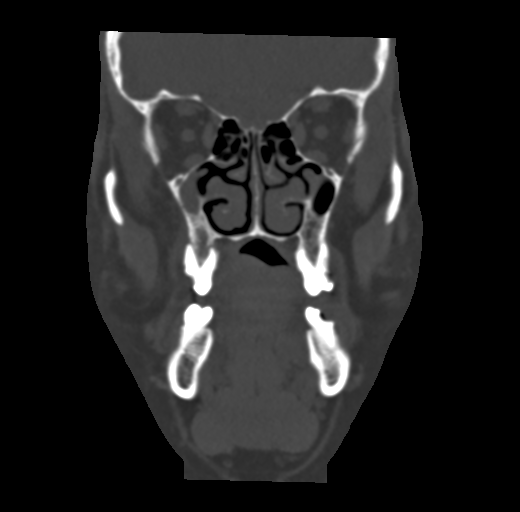
[im 46/83  bone]
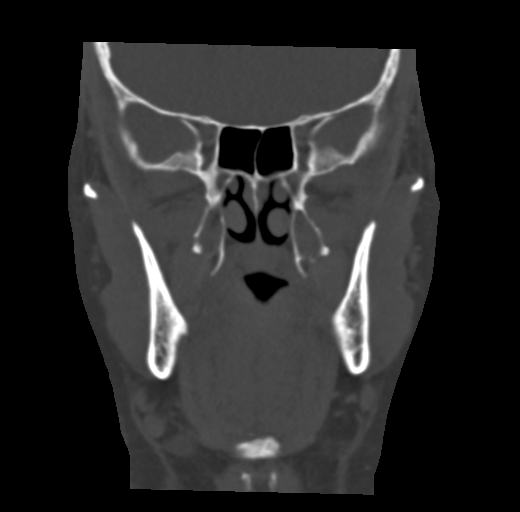
[im 55/83  bone]
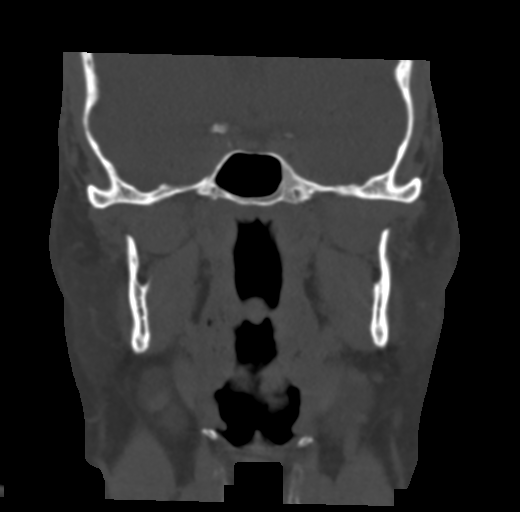

[Series 8: sagittal soft · sagittal · 0.37mm/px · 3 of 90 slices shown]
[im 30/90  bone]
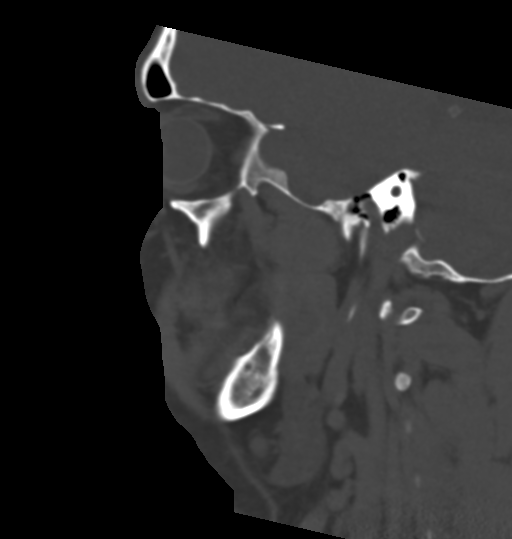
[im 45/90  bone]
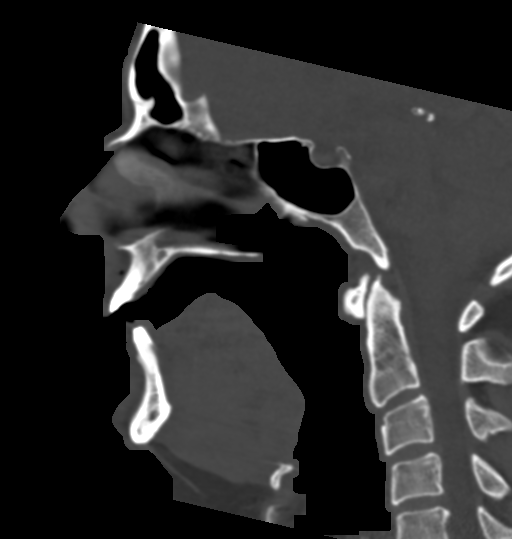
[im 60/90  bone]
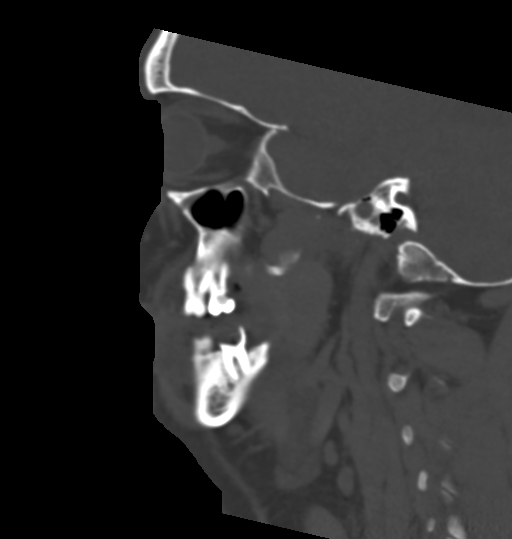

[16 of 47 positions shown; findings below may reference images not displayed]

FINDINGS: CT HEAD FINDINGS

Brain: No evidence of acute infarction, hemorrhage, hydrocephalus,
extra-axial collection or mass lesion/mass effect.

Vascular: No hyperdense vessel or unexpected calcification.

Skull: Normal. Negative for fracture or focal lesion.

Other: None.

CT MAXILLOFACIAL FINDINGS

Osseous: No fracture or mandibular dislocation. No destructive
process.

Orbits: Negative. No traumatic or inflammatory finding.

Sinuses: Hypoplastic maxillary sinuses, with complete opacification
of the right maxillary sinus. The paranasal sinuses are otherwise
clear. The mastoids are well aerated.

Soft tissues: No acute finding.
IMPRESSION: 1.  No acute intracranial process.
2. Complete opacification of the hypoplastic right maxillary sinus.
Correlate for signs of sinusitis.
3. No other acute abnormality in the face.

## 2021-03-16 NOTE — ED Triage Notes (Signed)
Pt to ED from home c/o facial pain, states feels like he has a fever, states face is swollen from right jaw to top of head.  No obvious swelling noted, speaking in complete and coherent sentences.  Denies nasal congestion or cough, states some pressure underneath eyes but mainly around right cheekbone.  A&Ox4, chest rise even and unlabored, skin WNL and in NAD at this time. ?

## 2021-03-17 ENCOUNTER — Emergency Department
Admission: EM | Admit: 2021-03-17 | Discharge: 2021-03-17 | Disposition: A | Discharge status: Home / Self Care | Payer: BC Managed Care – PPO | Source: Home / Self Care | Attending: Emergency Medicine | Admitting: Emergency Medicine

## 2021-03-17 ENCOUNTER — Other Ambulatory Visit: Payer: Self-pay

## 2021-03-17 DIAGNOSIS — E119 Type 2 diabetes mellitus without complications: Secondary | ICD-10-CM | POA: Insufficient documentation

## 2021-03-17 DIAGNOSIS — J0100 Acute maxillary sinusitis, unspecified: Secondary | ICD-10-CM | POA: Insufficient documentation

## 2021-03-17 DIAGNOSIS — I10 Essential (primary) hypertension: Secondary | ICD-10-CM | POA: Insufficient documentation

## 2021-03-17 DIAGNOSIS — R519 Headache, unspecified: Secondary | ICD-10-CM | POA: Insufficient documentation

## 2021-03-17 DIAGNOSIS — K0889 Other specified disorders of teeth and supporting structures: Secondary | ICD-10-CM | POA: Insufficient documentation

## 2021-03-17 MED ORDER — OXYCODONE-ACETAMINOPHEN 5-325 MG PO TABS: 1.0000 | ORAL_TABLET | Freq: Four times a day (QID) | ORAL | 0 refills | Status: AC | PRN

## 2021-03-17 MED ORDER — PSEUDOEPHEDRINE HCL 30 MG PO TABS: 30.0000 mg | ORAL_TABLET | Freq: Four times a day (QID) | ORAL | 2 refills | Status: AC | PRN

## 2021-03-17 MED ORDER — OXYMETAZOLINE HCL 0.05 % NA SOLN: 1.0000 | Freq: Two times a day (BID) | NASAL | 0 refills | Status: AC

## 2021-03-17 MED ORDER — KETOROLAC TROMETHAMINE 60 MG/2ML IM SOLN
30.0000 mg | Freq: Once | INTRAMUSCULAR | Status: AC
  Administered 2021-03-17: 30 mg via INTRAMUSCULAR
  Filled 2021-03-17: qty 2

## 2021-03-17 MED ORDER — AMOXICILLIN 500 MG PO TABS: 500.0000 mg | ORAL_TABLET | Freq: Three times a day (TID) | ORAL | 0 refills | Status: AC

## 2021-03-17 NOTE — ED Triage Notes (Signed)
Pt presents to ER c/o right side head pain that has been going on since Monday.  Pt has been seen here twice now for same.  Pt has been prescribed meds for pain, and has taken them as prescribed without relief.  Pt is A&O x4 at this time.  NAD noted.   ?

## 2021-03-17 NOTE — ED Provider Notes (Signed)
? ?[facility] ?Provider Note ? ? ? Event Date/Time  ? First MD Initiated Contact with Patient 03/16/21 2329   ?  (approximate) ? ? ?History  ? ?Facial Pain ? ? ?HPI ? ?Lonnie Price is a 46 y.o. male with history of diabetes and hypertension who presents for evaluation of right-sided facial pain and swelling.  Patient reports that he felt like his right forehead and cheek area were swollen earlier today, they felt hot to the touch.  He was having sharp pains.  He reports that all his symptoms have now resolved.  He denies a headache, congestion, facial droop, slurred speech, unilateral weakness or numbness, cough or fever. ?  ? ? ?Past Medical History:  ?Diagnosis Date  ? 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 Diabetic neuropathy (HCC)   ? Hypertension   ? ? ?Past Surgical History:  ?Procedure Laterality Date  ? WISDOM TOOTH EXTRACTION    ? ? ? ?Physical Exam  ? ?Triage Vital Signs: ?ED Triage Vitals  ?Enc Vitals Group  ?   BP 03/16/21 2130 (!) 170/108  ?   Pulse Rate 03/16/21 2130 94  ?   Resp 03/16/21 2130 16  ?   Temp 03/16/21 2130 98.9 ?F (37.2 ?C)  ?   Temp Source 03/16/21 2130 Oral  ?   SpO2 03/16/21 2130 94 %  ?   Weight 03/16/21 2131 225 lb (102.1 kg)  ?   Height 03/16/21 2131 6' (1.829 m)  ?   Head Circumference --   ?   Peak Flow --   ?   Pain Score 03/16/21 2130 8  ?   Pain Loc --   ?   Pain Edu? --   ?   Excl. in GC? --   ? ? ?Most recent vital signs: ?Vitals:  ? 03/16/21 2130  ?BP: (!) 170/108  ?Pulse: 94  ?Resp: 16  ?Temp: 98.9 ?F (37.2 ?C)  ?SpO2: 94%  ? ? ? ?Constitutional: Alert and oriented. Well appearing and in no apparent distress. ?HEENT: ?     Head: Normocephalic and atraumatic.    ?     Face: No sinus tenderness, no swelling, no trismus, no erythema, no warmth ?     Eyes: Conjunctivae are normal. Sclera is non-icteric.  ?     Mouth/Throat: Mucous membranes are moist.  ?     Neck: Supple with no signs of meningismus. ?Cardiovascular: Regular rate and  rhythm. No murmurs, gallops, or rubs. 2+ symmetrical distal pulses are present in all extremities.  ?Respiratory: Normal respiratory effort. Lungs are clear to auscultation bilaterally.  ?Gastrointestinal: Soft, non tender. ?Musculoskeletal:  No edema, cyanosis, or erythema of extremities. ?Neurologic: Normal speech and language. Face is symmetric. Moving all extremities. No gross focal neurologic deficits are appreciated. ?Skin: Skin is warm, dry and intact. No rash noted. ?Psychiatric: Mood and affect are normal. Speech and behavior are normal. ? ?ED Results / Procedures / Treatments  ? ?Labs ?(all labs ordered are listed, but only abnormal results are displayed) ?Labs Reviewed - No data to display ? ? ?EKG ? ?none ? ? ?RADIOLOGY ?I, Nita Sickle, attending MD, have personally viewed and interpreted the images obtained during this visit as below: ? ?CT head is unremarkable.  CT face that show complete opacification of the right maxillary sinus ? ? ?___________________________________________________ ?Interpretation by Radiologist:  ?CT HEAD WO CONTRAST ( ) ? ?Result Date: 03/16/2021 ?CLINICAL DATA:  Headache, facial pain EXAM:  CT HEAD WITHOUT CONTRAST CT MAXILLOFACIAL WITHOUT CONTRAST TECHNIQUE: Multidetector CT imaging of the head and maxillofacial structures were performed using the standard protocol without intravenous contrast. Multiplanar CT image reconstructions of the maxillofacial structures were also generated. RADIATION DOSE REDUCTION: This exam was performed according to the departmental dose-optimization program which includes automated exposure control, adjustment of the mA and/or kV according to patient size and/or use of iterative reconstruction technique. COMPARISON:  None. FINDINGS: CT HEAD FINDINGS Brain: No evidence of acute infarction, hemorrhage, hydrocephalus, extra-axial collection or mass lesion/mass effect. Vascular: No hyperdense vessel or unexpected calcification. Skull: Normal.  Negative for fracture or focal lesion. Other: None. CT MAXILLOFACIAL FINDINGS Osseous: No fracture or mandibular dislocation. No destructive process. Orbits: Negative. No traumatic or inflammatory finding. Sinuses: Hypoplastic maxillary sinuses, with complete opacification of the right maxillary sinus. The paranasal sinuses are otherwise clear. The mastoids are well aerated. Soft tissues: No acute finding. IMPRESSION: 1.  No acute intracranial process. 2. 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 Correlate for signs of sinusitis. 3. No other acute abnormality in the face. Electronically Signed   By: Wiliam Ke M.D.   On: 03/16/2021 22:03  ? ?CT Maxillofacial Wo Contrast ? ?Result Date: 03/16/2021 ?CLINICAL DATA:  Headache, facial pain EXAM: CT HEAD WITHOUT CONTRAST CT MAXILLOFACIAL WITHOUT CONTRAST TECHNIQUE: Multidetector CT imaging of the head and maxillofacial structures were performed using the standard protocol without intravenous contrast. Multiplanar CT image reconstructions of the maxillofacial structures were also generated. RADIATION DOSE REDUCTION: This exam was performed according to the departmental dose-optimization program which includes automated exposure control, adjustment of the mA and/or kV according to patient size and/or use of iterative reconstruction technique. COMPARISON:  None. FINDINGS: CT HEAD FINDINGS Brain: No evidence of acute infarction, hemorrhage, hydrocephalus, extra-axial collection or mass lesion/mass effect. Vascular: No hyperdense vessel or unexpected calcification. Skull: Normal. Negative for fracture or focal lesion. Other: None. CT MAXILLOFACIAL FINDINGS Osseous: No fracture or mandibular dislocation. No destructive process. Orbits: Negative. No traumatic or inflammatory finding. Sinuses: Hypoplastic maxillary sinuses, with complete opacification of the right maxillary sinus. The paranasal sinuses are otherwise clear. The mastoids are well aerated.  Soft tissues: No acute finding. IMPRESSION: 1.  No acute intracranial process. 2. 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 Correlate for signs of sinusitis. 3. No other acute abnormality in the face. Electronically Signed   By: Wiliam Ke M.D.   On: 03/16/2021 22:03   ? ? ? ?PROCEDURES: ? ?Critical Care performed: No ? ?Procedures ? ? ? ?IMPRESSION / MDM / ASSESSMENT AND PLAN / ED COURSE  ?I reviewed the triage vital signs and the nursing notes. ? ?46 y.o. male with history of diabetes and hypertension who presents for evaluation of right-sided facial pain and swelling.  Patient reports that his symptoms resolved after arriving to the ED.  There is no swelling, no erythema, no signs of cellulitis, no sinus tenderness, neurological exam of the cranial nerves are intact. ? ?Ddx: Sinus infection versus trigeminal neuralgia versus migraine headache ? ? ?Plan: CT head and face ? ? ?MEDICATIONS GIVEN IN ED: ?Medications - No data to display ? ? ?ED COURSE: CTs showing total opacification of the right maxillary sinus which is most likely the cause of patient's symptoms.  There is no abscess formation.  He is completely neurologically intact otherwise.  Completely asymptomatic at this time and no indication for admission.  Will discharge home with sinus rinses, Sudafed and Afrin.  Discussed close follow-up with  primary care doctor and my standard return precautions ? ? ?Consults: None ? ? ?EMR reviewed including last visit with his primary care doctor from March 2023 for hypertension and DM ? ? ? ?FINAL CLINICAL IMPRESSION(S) / ED DIAGNOSES  ? ?Final diagnoses:  ?Acute non-recurrent sinusitis, unspecified location  ? ? ? ?Rx / DC Orders  ? ?ED Discharge Orders   ? ?      Ordered  ?  pseudoephedrine (SUDAFED) 30 MG tablet  Every 6 hours PRN       ? 03/17/21 0050  ?  oxymetazoline (AFRIN NASAL SPRAY) 0.05 % nasal spray  2 times daily       ? 03/17/21 0050  ? ?  ?  ? ?  ? ? ? ?Note:  This document  was prepared using Dragon voice recognition software and may include unintentional dictation errors. ? ? ?Please note:  Patient was evaluated in Emergency Department today for the symptoms described in the h

## 2021-03-17 NOTE — Discharge Instructions (Signed)
Please keep the dental appointment as scheduled unless you are able to be evaluated earlier. ? ?Take the antibiotic as prescribed until finished even if you are feeling better. ? ?Take ibuprofen every 6 hours.  If pain does not improve with ibuprofen, take the pain medication. ?

## 2021-03-17 NOTE — ED Provider Notes (Signed)
? ?[facility] ?Provider Note ? ? ? Event Date/Time  ? First MD Initiated Contact with Patient 03/17/21 1709   ?  (approximate) ? ? ?History  ? ?Facial Pain ? ? ?HPI ? ?Lonnie Price is a 46 y.o. male with history of hypertension, diabetes and as listed in EMR presents to the emergency department for treatment and evaluation of right-sided headache, facial pressure, and dental pain.  He was evaluated here last night and advised to take Sudafed and Afrin.  He has had no relief with these medications and is not able to sleep. Pain is worse. ? ?  ? ? ?Physical Exam  ? ?Triage Vital Signs: ?ED Triage Vitals  ?Enc Vitals Group  ?   BP 03/17/21 1627 (!) 150/98  ?   Pulse Rate 03/17/21 1627 97  ?   Resp 03/17/21 1627 17  ?   Temp 03/17/21 1627 98 ?F (36.7 ?C)  ?   Temp src --   ?   SpO2 03/17/21 1627 96 %  ?   Weight 03/17/21 1628 225 lb (102.1 kg)  ?   Height 03/17/21 1628 6' (1.829 m)  ?   Head Circumference --   ?   Peak Flow --   ?   Pain Score 03/17/21 1628 6  ?   Pain Loc --   ?   Pain Edu? --   ?   Excl. in GC? --   ? ? ?Most recent vital signs: ?Vitals:  ? 03/17/21 1627 03/17/21 1810  ?BP: (!) 150/98 (!) 129/92  ?Pulse: 97 89  ?Resp: 17 18  ?Temp: 98 ?F (36.7 ?C)   ?SpO2: 96% 98%  ? ? ?General: Awake, no distress.  ?CV:  Good peripheral perfusion.  ?Resp:  Normal effort.  ?Abd:  No distention.  ?Other:  Tender over maxillary and frontal sinus on the right side. ?  Dental pain right upper molar without obvious abscess. ? ? ?ED Results / Procedures / Treatments  ? ?Labs ?(all labs ordered are listed, but only abnormal results are displayed) ?Labs Reviewed - No data to display ? ? ?EKG ? ?Not indicated ? ? ?RADIOLOGY ? ?Image and radiology report reviewed by me. ? ?Not indicated ? ?PROCEDURES: ? ?Critical Care performed: No ? ?Procedures ? ? ?MEDICATIONS ORDERED IN ED: ?Medications  ?ketorolac (TORADOL) injection 30 mg (30 mg Intramuscular Given 03/17/21 1807)  ? ? ? ?IMPRESSION / MDM / ASSESSMENT  AND PLAN / ED COURSE  ? ?I have reviewed the triage note. ? ?Differential diagnosis includes, but is not limited to, sinusitis, dental abscess, dental pain ? ?46 year old male presenting to the emergency department for treatment and evaluation of right-sided facial pain.  See HPI for further details. ? ?Patient was evaluated here last night and underwent a CT of the head and maxillofacial bones.  Complete opacification of the right maxillary sinus was noted.  Patient was discharged home and instructed to use Afrin and Sudafed.  He reports no relief with these medicines and has also tried ibuprofen. ? ?Plan today will be to treat with antibiotics and a few tablets of pain medications.  He has a dentist appointment scheduled for the 27th.  He was encouraged to try and get an earlier appointment if possible but if not to keep that appointment as scheduled.  If symptoms change or worsen and he is unable to be evaluated by his primary care provider or his dentist he is to return to the emergency department. ? ?  ? ? ?  FINAL CLINICAL IMPRESSION(S) / ED DIAGNOSES  ? ?Final diagnoses:  ?Sinus headache  ?Pain, dental  ? ? ? ?Rx / DC Orders  ? ?ED Discharge Orders   ? ?      Ordered  ?  amoxicillin (AMOXIL) 500 MG tablet  3 times daily       ? 03/17/21 1827  ?  oxyCODONE-acetaminophen (PERCOCET) 5-325 MG tablet  Every 6 hours PRN       ? 03/17/21 1827  ? ?  ?  ? ?  ? ? ? ?Note:  This document was prepared using Dragon voice recognition software and may include unintentional dictation errors. ?  ?Chinita Pester, FNP ?03/17/21 1839 ? ?  ?Phineas Semen, MD ?03/17/21 1844 ? ?

## 2021-03-17 NOTE — ED Triage Notes (Signed)
Patient to ER via [facility] with complaints of sinus pain, states the pain is only on the right side of his face and radiates into his jaw and forehead. States he was seen here last right and was advised to take sudaphed but he has seen no improvement in his symptoms.  ?

## 2021-03-17 NOTE — ED Notes (Signed)
6 yom with a c/c of right sided facial and head pain since Monday. The pt was seen last night in the ED ref the same.  ?

## 2021-03-18 ENCOUNTER — Emergency Department
Admission: EM | Admit: 2021-03-18 | Discharge: 2021-03-18 | Disposition: A | Discharge status: Home / Self Care | Payer: BC Managed Care – PPO | Source: Home / Self Care | Attending: Emergency Medicine | Admitting: Emergency Medicine

## 2021-03-18 ENCOUNTER — Emergency Department: Payer: BC Managed Care – PPO

## 2021-03-18 DIAGNOSIS — J0100 Acute maxillary sinusitis, unspecified: Secondary | ICD-10-CM

## 2021-03-18 LAB — BASIC METABOLIC PANEL
Anion gap: 8 (ref 5–15)
BUN: 12 mg/dL (ref 6–20)
CO2: 20 mmol/L — ABNORMAL LOW (ref 22–32)
Calcium: 7.7 mg/dL — ABNORMAL LOW (ref 8.9–10.3)
Chloride: 109 mmol/L (ref 98–111)
Creatinine, Ser: 0.93 mg/dL (ref 0.61–1.24)
GFR, Estimated: >60 mL/min (ref >60)
Glucose, Bld: 155 mg/dL — ABNORMAL HIGH (ref 70–99)
Potassium: 3 mmol/L — ABNORMAL LOW (ref 3.5–5.1)
Sodium: 137 mmol/L (ref 135–145)

## 2021-03-18 LAB — CBC WITH DIFFERENTIAL/PLATELET
Abs Immature Granulocytes: 0.06 10*3/uL (ref 0.00–0.07)
Basophils Absolute: 0.1 10*3/uL (ref 0.0–0.1)
Basophils Relative: 0 %
Eosinophils Absolute: 0.1 10*3/uL (ref 0.0–0.5)
Eosinophils Relative: 1 %
HCT: 42.6 % (ref 39.0–52.0)
Hemoglobin: 15.2 g/dL (ref 13.0–17.0)
Immature Granulocytes: 0 %
Lymphocytes Relative: 13 %
Lymphs Abs: 1.9 10*3/uL (ref 0.7–4.0)
MCH: 29.7 pg (ref 26.0–34.0)
MCHC: 35.7 g/dL (ref 30.0–36.0)
MCV: 83.2 fL (ref 80.0–100.0)
Monocytes Absolute: 1.3 10*3/uL — ABNORMAL HIGH (ref 0.1–1.0)
Monocytes Relative: 9 %
Neutro Abs: 11 10*3/uL — ABNORMAL HIGH (ref 1.7–7.7)
Neutrophils Relative %: 77 %
Platelets: 216 10*3/uL (ref 150–400)
RBC: 5.12 MIL/uL (ref 4.22–5.81)
RDW: 11.7 % (ref 11.5–15.5)
WBC: 14.4 10*3/uL — ABNORMAL HIGH (ref 4.0–10.5)
nRBC: 0 % (ref 0.0–0.2)

## 2021-03-18 LAB — SEDIMENTATION RATE: Sed Rate: 26 mm/hr — ABNORMAL HIGH (ref 0–15)

## 2021-03-18 IMAGING — MR MR MRA HEAD W/O CM
1 series · 19 of 48 positions shown · IV contrast (gadavist)
Comparison: No prior MRI, correlation is made with CT head
[DATE]

CLINICAL DATA: Headache, vertigo

EXAM:
MRI HEAD WITHOUT CONTRAST
MRA HEAD WITHOUT CONTRAST
MRV HEAD WITHOUT AND WITH CONTRAST
TECHNIQUE: Multiplanar, multi-echo pulse sequences of the brain and surrounding
structures were acquired without intravenous contrast. Angiographic
images of the Circle of Willis were acquired using MRA technique
without intravenous contrast. Angiographic images of the
intracranial venous structures were acquired using MRV technique
without and with intravenous contrast.
CONTRAST:  10mL GADAVIST GADOBUTROL 1 MMOL/ML IV SOLN

[Series 1: TOF · axial · 0.5mm · 0.41mm/px · z∈[+22,+116]mm · 19 of 205 slices shown]
[im 1/205]
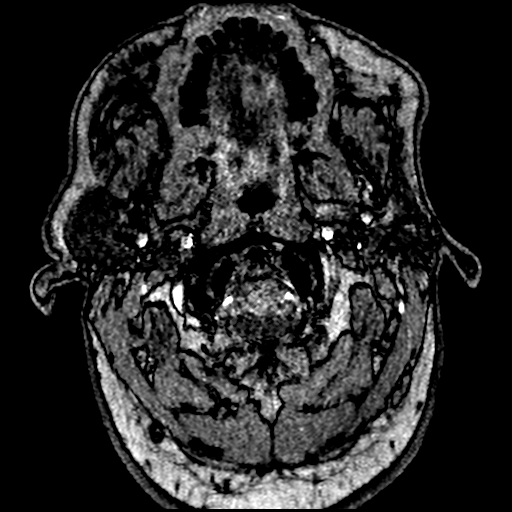
[im 5/205]
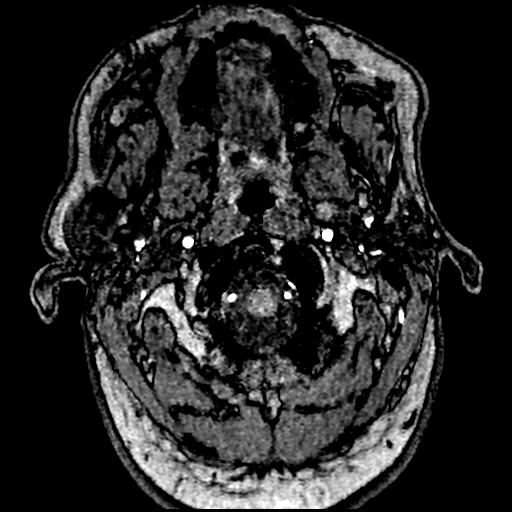
[im 9/205]
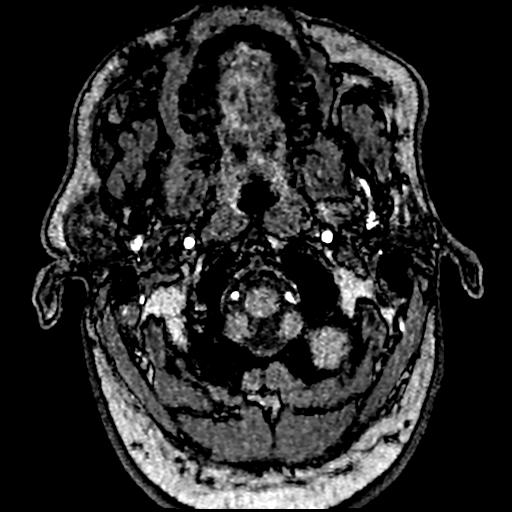
[im 14/205]
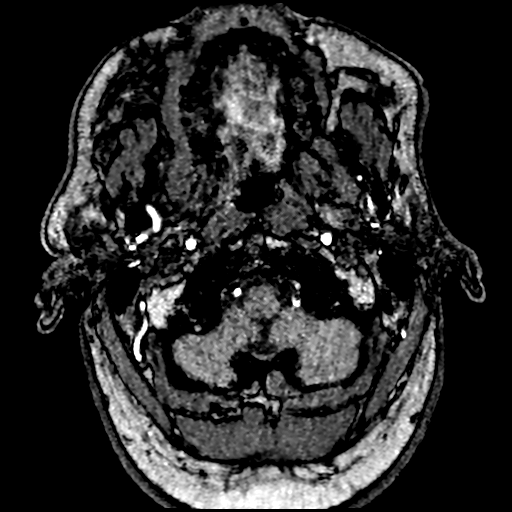
[im 18/205]
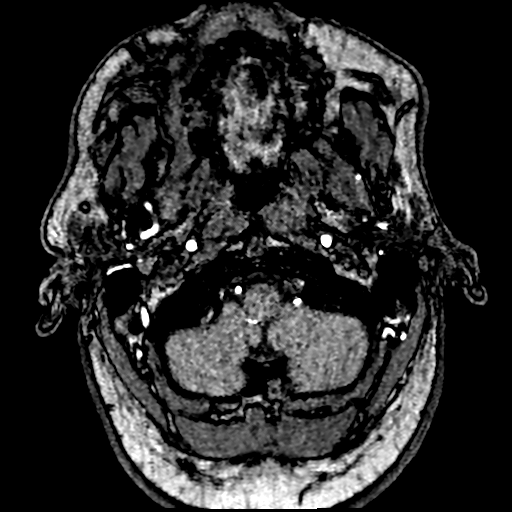
[im 22/205]
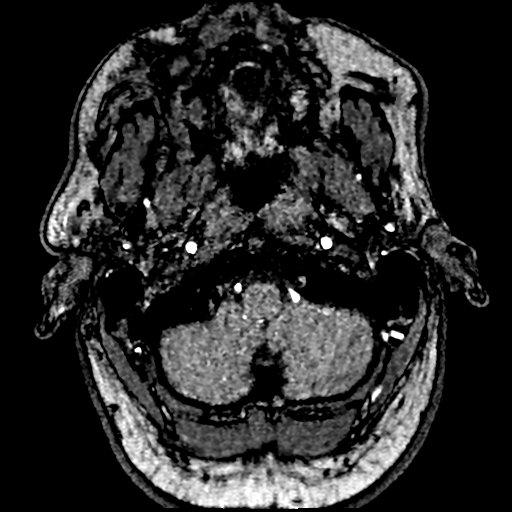
[im 27/205]
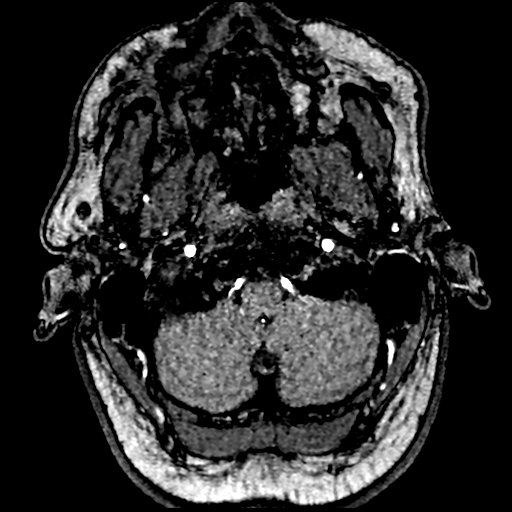
[im 31/205]
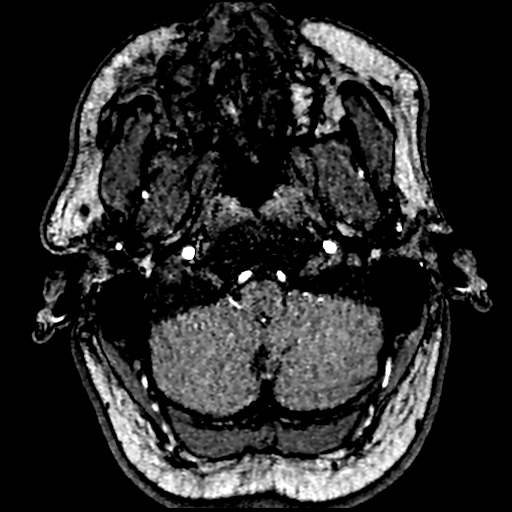
[im 35/205]
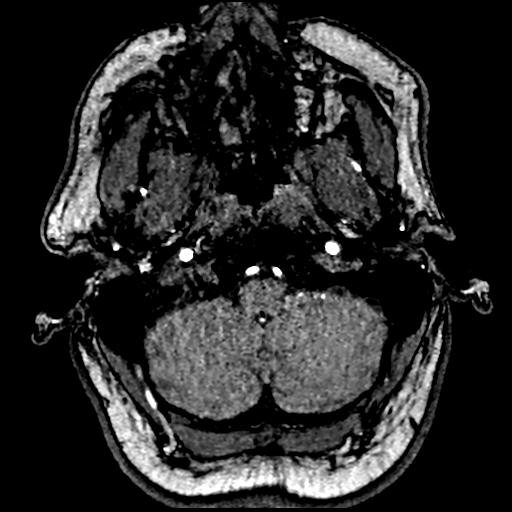
[im 40/205]
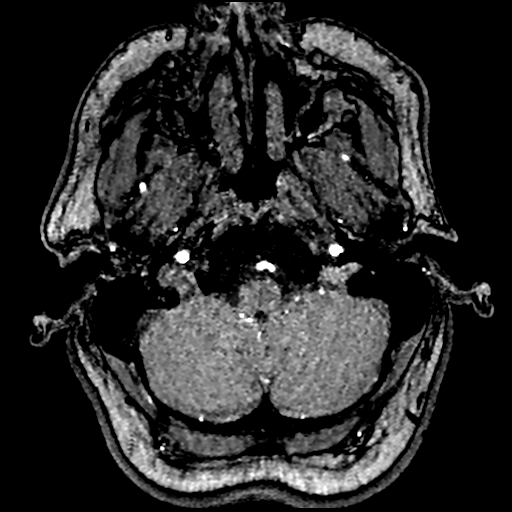
[im 44/205]
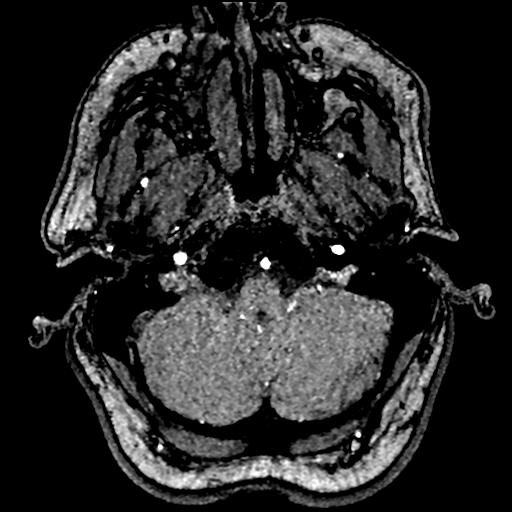
[im 66/205]
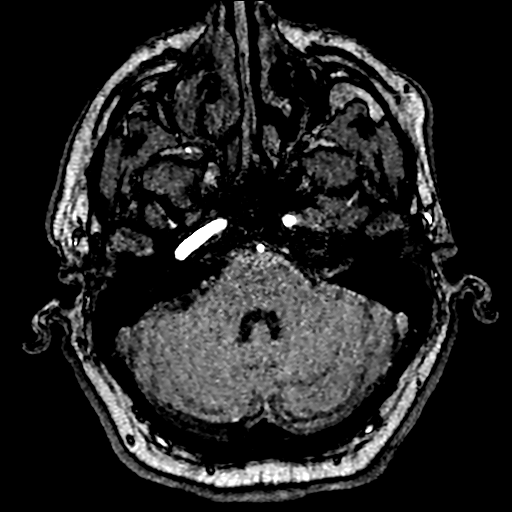
[im 92/205]
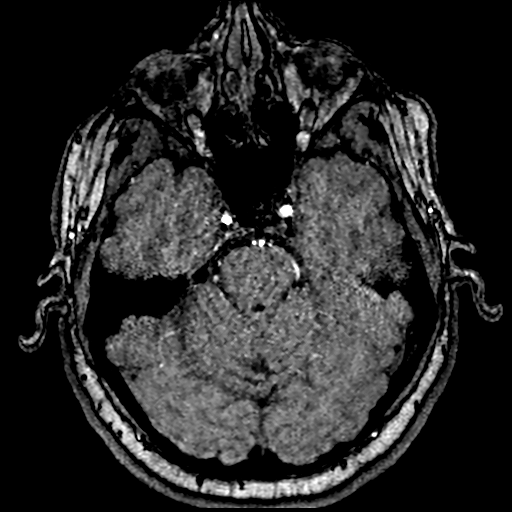
[im 105/205]
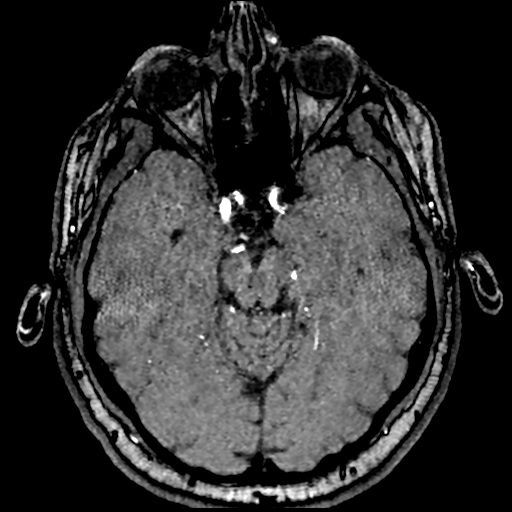
[im 118/205]
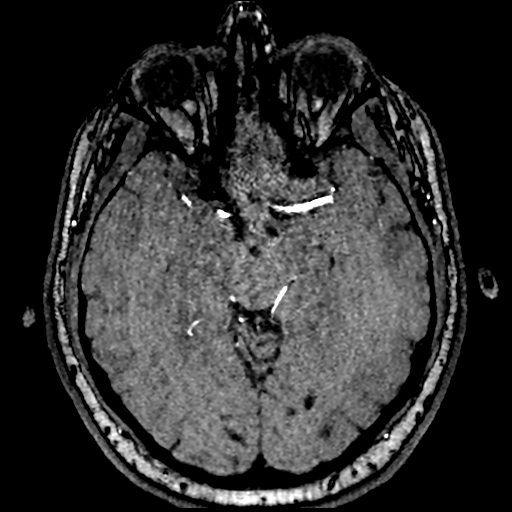
[im 144/205]
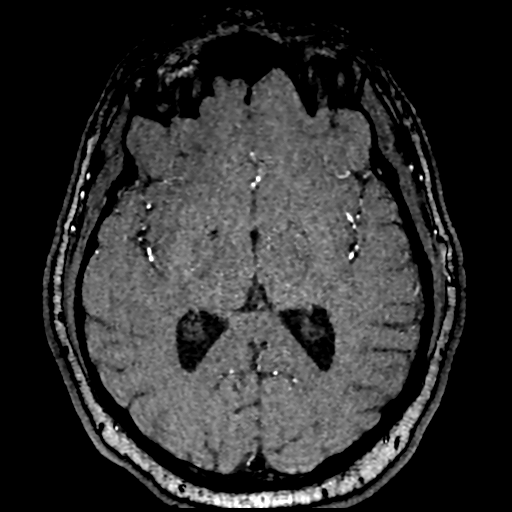
[im 170/205]
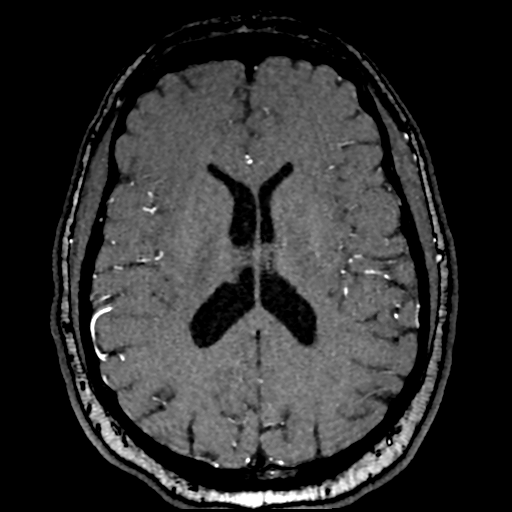
[im 174/205]
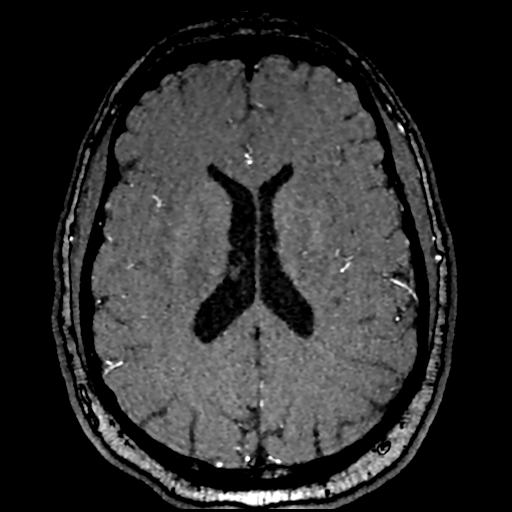
[im 196/205]
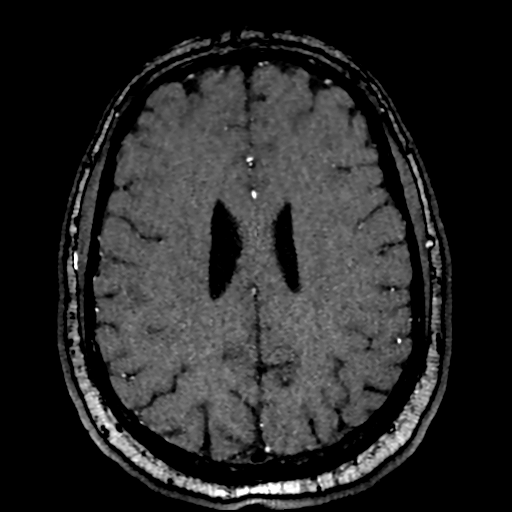

[19 of 48 positions shown; findings below may reference images not displayed]

FINDINGS: MRI HEAD FINDINGS

Brain: No restricted diffusion to suggest acute or subacute infarct.
No acute hemorrhage, mass, mass effect, or midline shift. No
hydrocephalus or extra-axial collection. Cerebral volume is within
normal limits for age.

Vascular: Normal flow voids.

Skull and upper cervical spine: Normal marrow signal.

Sinuses/Orbits: Mucosal thickening in the hypoplastic right
maxillary sinus. Otherwise clear. The orbits are unremarkable.

Other: The mastoids are well aerated.

MRA HEAD FINDINGS

Anterior circulation: Both internal carotid arteries are patent to
the termini, without significant stenosis.

A1 segments patent. Normal anterior communicating artery. Anterior
cerebral arteries are patent to their distal aspects.

No M1 stenosis or occlusion. Normal MCA bifurcations. Distal MCA
branches perfused and symmetric.

Posterior circulation: Vertebral arteries patent to the
vertebrobasilar junction without stenosis. Posterior inferior
cerebral arteries patent bilaterally.

Basilar patent to its distal aspect. Superior cerebellar arteries
patent bilaterally.

Patent P1 segments. PCAs perfused to their distal aspects without
stenosis. The right posterior communicating artery is patent. The
left posterior communicating artery is not visualized.

Anatomic variants: None significant

MRV HEAD FINDINGS

There is no evidence of dural venous sinus or deep cerebral vein
thrombosis. No dural venous sinus stenosis.
IMPRESSION: 1.  No acute intracranial process.
2.  No intracranial large vessel occlusion or significant stenosis.
3. No evidence of dural venous sinus thrombosis or stenosis.

## 2021-03-18 IMAGING — MR MR HEAD W/O CM
11 series · 44 of 48 positions shown · IV contrast (gadavist)
Comparison: No prior MRI, correlation is made with CT head
[DATE]

CLINICAL DATA: Headache, vertigo

EXAM:
MRI HEAD WITHOUT CONTRAST
MRA HEAD WITHOUT CONTRAST
MRV HEAD WITHOUT AND WITH CONTRAST
TECHNIQUE: Multiplanar, multi-echo pulse sequences of the brain and surrounding
structures were acquired without intravenous contrast. Angiographic
images of the Circle of Willis were acquired using MRA technique
without intravenous contrast. Angiographic images of the
intracranial venous structures were acquired using MRV technique
without and with intravenous contrast.
CONTRAST:  10mL GADAVIST GADOBUTROL 1 MMOL/ML IV SOLN

[Series 5: ax dwi_tracew · axial · 3.0mm · 0.65mm/px · z∈[+40,+184]mm · 4 of 48 slices shown]
[im 1/48]
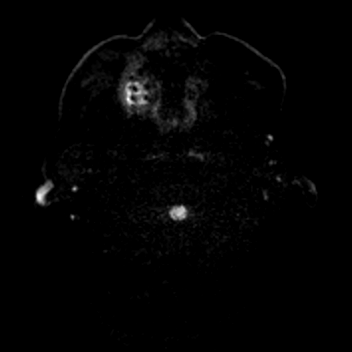
[im 16/48]
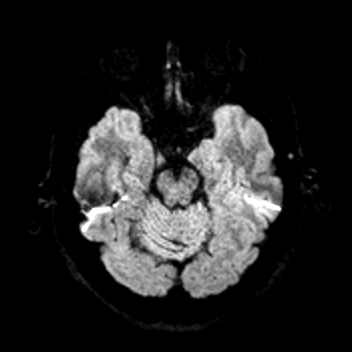
[im 32/48]
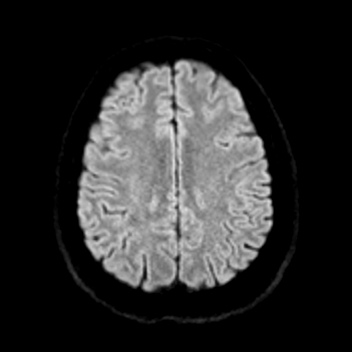
[im 48/48]
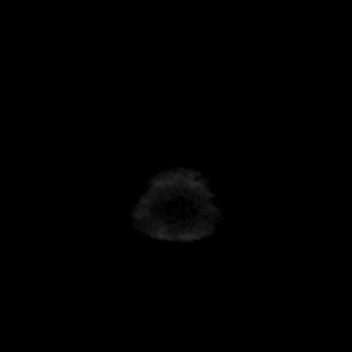

[Series 6: ax dwi_adc · axial · 3.0mm · 0.65mm/px · z∈[+40,+184]mm · 4 of 48 slices shown]
[im 1/48]
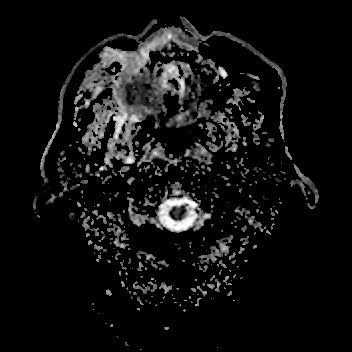
[im 16/48]
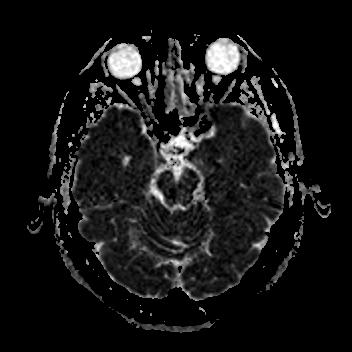
[im 32/48]
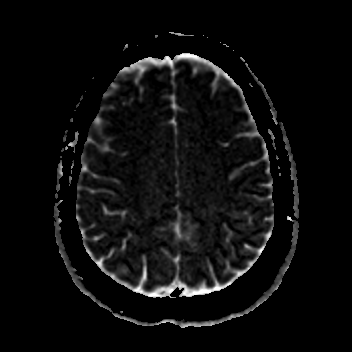
[im 48/48]
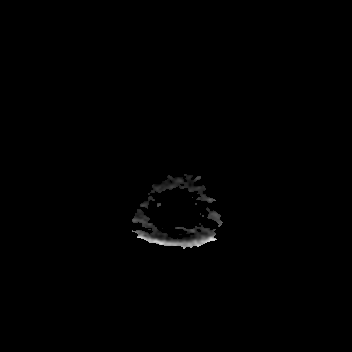

[Series 7: cor dwi_tracew · coronal · 5.0mm · 0.65mm/px · 3 of 40 slices shown]
[im 1/40]
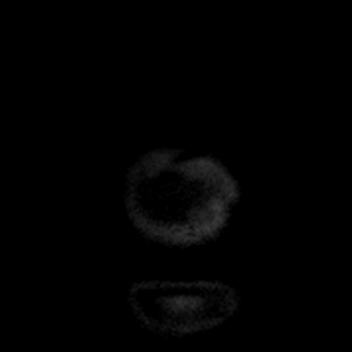
[im 20/40]
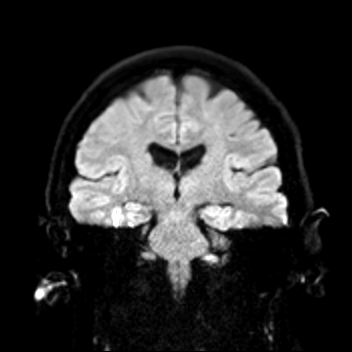
[im 40/40]
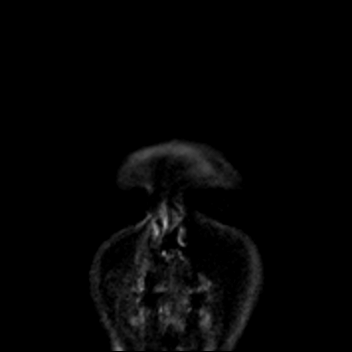

[Series 8: cor dwi_adc · coronal · 5.0mm · 0.65mm/px · 3 of 40 slices shown]
[im 1/40]
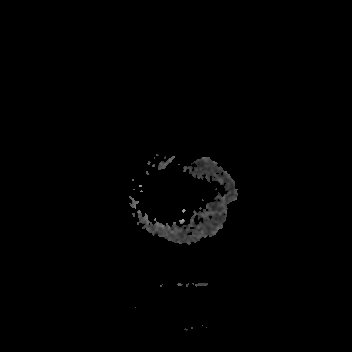
[im 20/40]
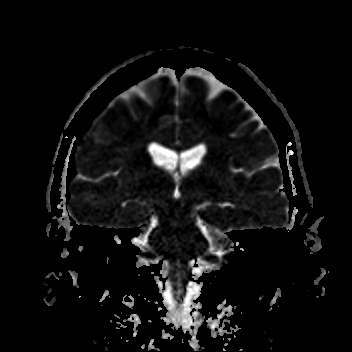
[im 40/40]
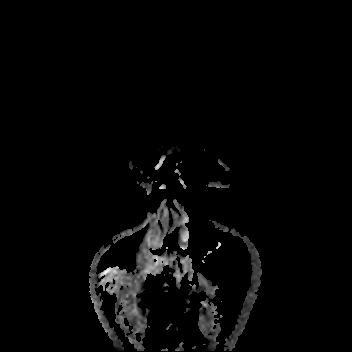

[Series 9: T1 · sagittal · 5.0mm · 0.62mm/px · 2 of 23 slices shown (1 of 2)]
[im 1/23]
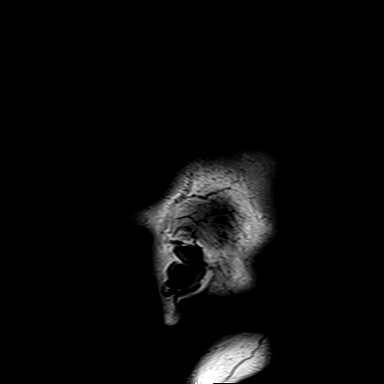
[im 23/23]
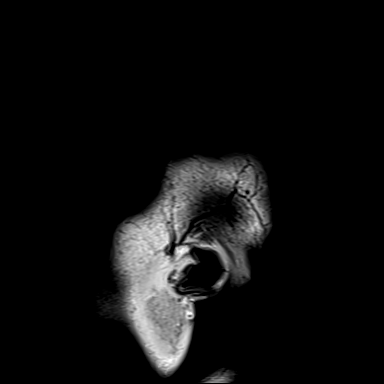

[Series 10: T2 · axial · 5.0mm · 0.53mm/px · z∈[+43,+176]mm · 2 of 25 slices shown (1 of 2)]
[im 1/25]
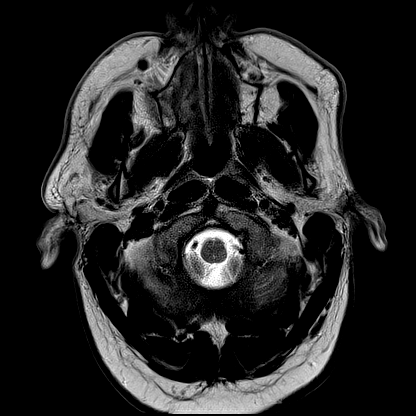
[im 25/25]
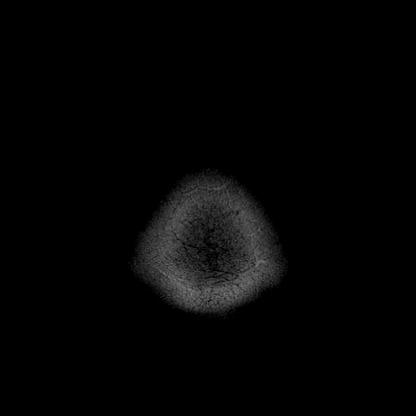

[Series 12: pha_images · axial · 3.0mm · 0.90mm/px · z∈[+30,+194]mm · 5 of 60 slices shown]
[im 1/60]
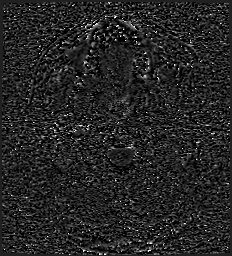
[im 15/60]
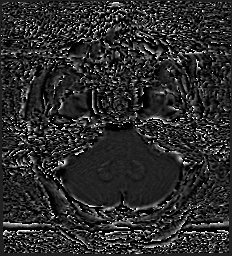
[im 30/60]
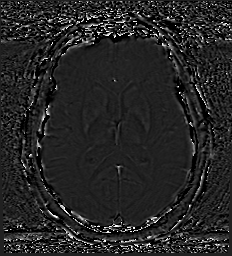
[im 45/60]
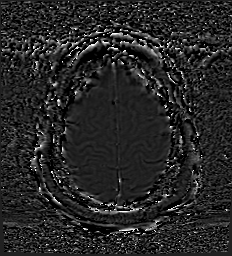
[im 60/60]
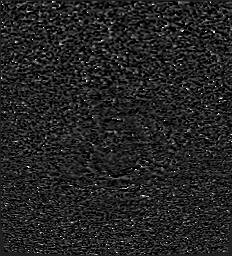

[Series 13: swi_images · axial · 3.0mm · 0.90mm/px · z∈[+30,+194]mm · 5 of 60 slices shown]
[im 1/60]
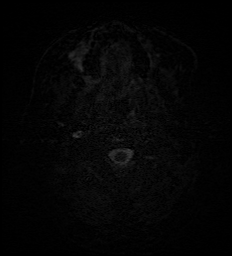
[im 15/60]
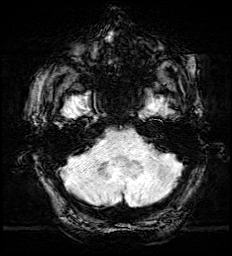
[im 30/60]
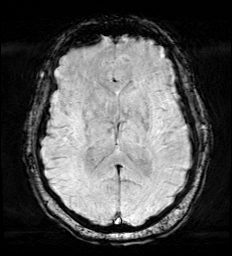
[im 45/60]
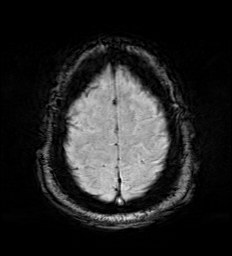
[im 60/60]
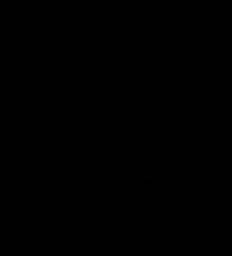

[Series 15: FLAIR · axial · 3.0mm · 0.53mm/px · z∈[+35,+185]mm · 4 of 55 slices shown]
[im 1/55]
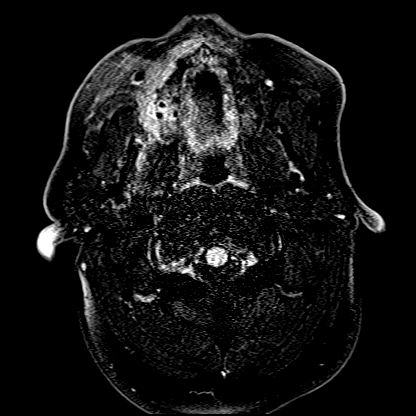
[im 19/55]
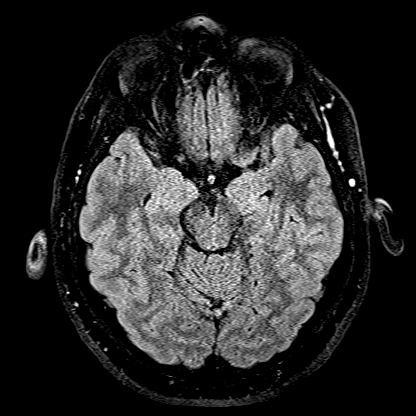
[im 37/55]
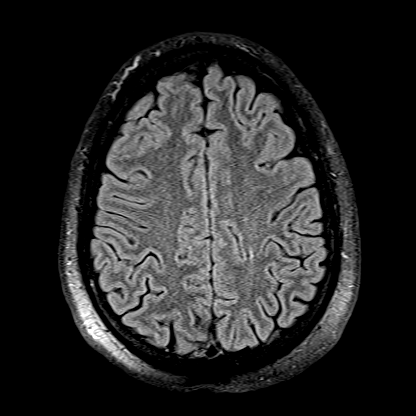
[im 55/55]
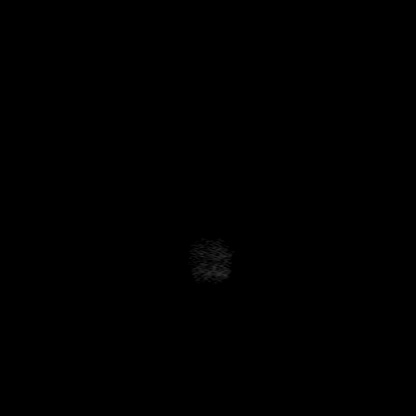

[Series 16: T1 · axial · 1.0mm · 0.98mm/px · z∈[+35,+197]mm · 10 of 176 slices shown (2 of 2)]
[im 1/176]
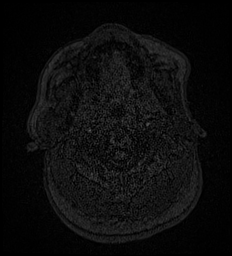
[im 14/176]
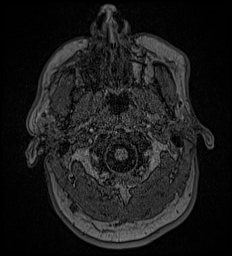
[im 27/176]
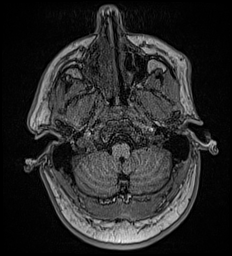
[im 41/176]
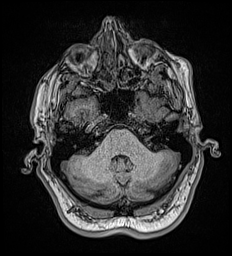
[im 54/176]
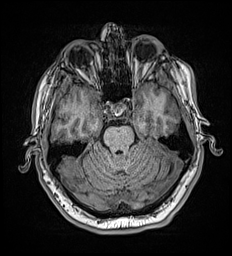
[im 81/176]
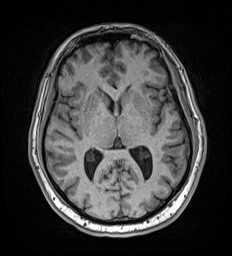
[im 95/176]
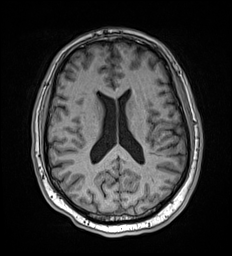
[im 122/176]
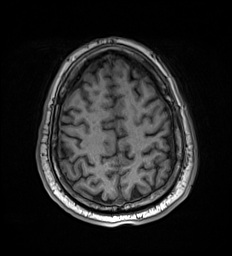
[im 149/176]
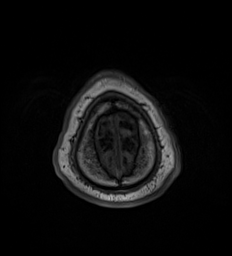
[im 176/176]
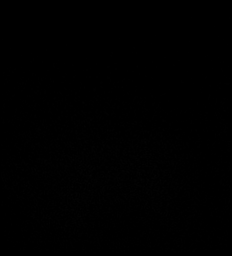

[Series 17: T2 · coronal · 5.0mm · 0.57mm/px · 2 of 29 slices shown (2 of 2)]
[im 1/29]
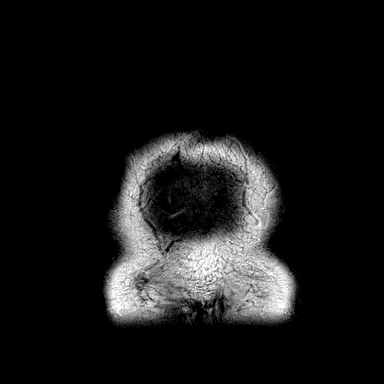
[im 29/29]
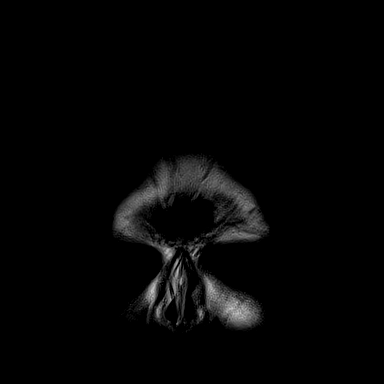

[44 of 48 positions shown; findings below may reference images not displayed]

FINDINGS: MRI HEAD FINDINGS

Brain: No restricted diffusion to suggest acute or subacute infarct.
No acute hemorrhage, mass, mass effect, or midline shift. No
hydrocephalus or extra-axial collection. Cerebral volume is within
normal limits for age.

Vascular: Normal flow voids.

Skull and upper cervical spine: Normal marrow signal.

Sinuses/Orbits: Mucosal thickening in the hypoplastic right
maxillary sinus. Otherwise clear. The orbits are unremarkable.

Other: The mastoids are well aerated.

MRA HEAD FINDINGS

Anterior circulation: Both internal carotid arteries are patent to
the termini, without significant stenosis.

A1 segments patent. Normal anterior communicating artery. Anterior
cerebral arteries are patent to their distal aspects.

No M1 stenosis or occlusion. Normal MCA bifurcations. Distal MCA
branches perfused and symmetric.

Posterior circulation: Vertebral arteries patent to the
vertebrobasilar junction without stenosis. Posterior inferior
cerebral arteries patent bilaterally.

Basilar patent to its distal aspect. Superior cerebellar arteries
patent bilaterally.

Patent P1 segments. PCAs perfused to their distal aspects without
stenosis. The right posterior communicating artery is patent. The
left posterior communicating artery is not visualized.

Anatomic variants: None significant

MRV HEAD FINDINGS

There is no evidence of dural venous sinus or deep cerebral vein
thrombosis. No dural venous sinus stenosis.
IMPRESSION: 1.  No acute intracranial process.
2.  No intracranial large vessel occlusion or significant stenosis.
3. No evidence of dural venous sinus thrombosis or stenosis.

## 2021-03-18 IMAGING — MR MR MRV HEAD WO/W CM
4 of 6 series · 35 of 48 positions shown · IV contrast (10ml Gadavist)
Comparison: No prior MRI, correlation is made with CT head
[DATE]

CLINICAL DATA: Headache, vertigo

EXAM:
MRI HEAD WITHOUT CONTRAST
MRA HEAD WITHOUT CONTRAST
MRV HEAD WITHOUT AND WITH CONTRAST
TECHNIQUE: Multiplanar, multi-echo pulse sequences of the brain and surrounding
structures were acquired without intravenous contrast. Angiographic
images of the Circle of Willis were acquired using MRA technique
without intravenous contrast. Angiographic images of the
intracranial venous structures were acquired using MRV technique
without and with intravenous contrast.
CONTRAST:  10mL GADAVIST GADOBUTROL 1 MMOL/ML IV SOLN

[Series 1: TOF · coronal · 2.5mm · 0.98mm/px · 9 of 128 slices shown]
[im 1/128]
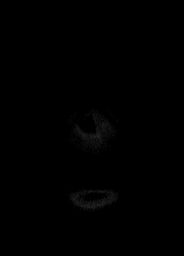
[im 16/128]
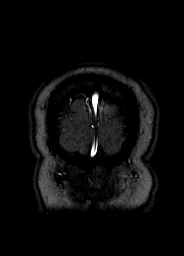
[im 32/128]
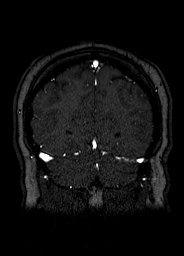
[im 48/128]
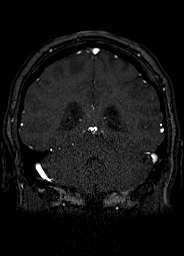
[im 64/128]
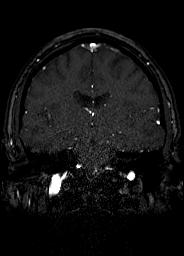
[im 80/128]
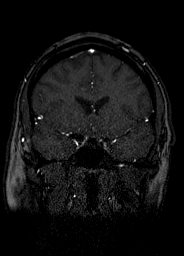
[im 96/128]
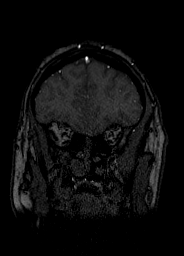
[im 112/128]
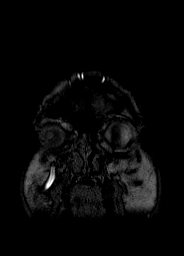
[im 128/128]
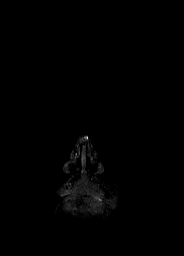

[Series 8: T1 post-contrast · sagittal · 1.0mm · 0.98mm/px · 10 of 175 slices shown]
[im 1/175]
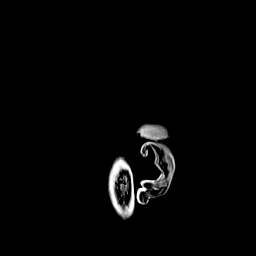
[im 16/175]
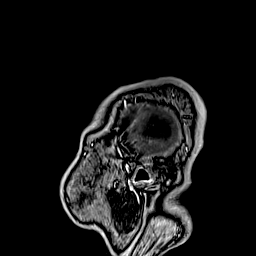
[im 32/175]
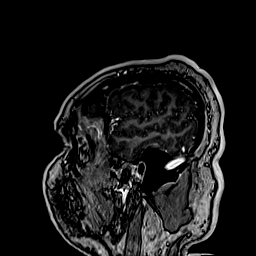
[im 48/175]
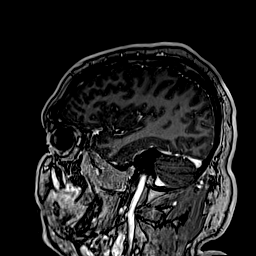
[im 80/175]
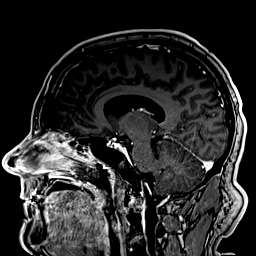
[im 95/175]
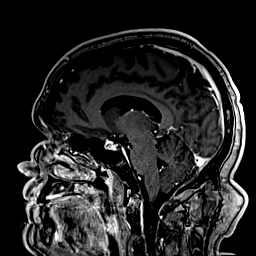
[im 127/175]
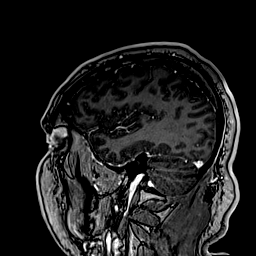
[im 143/175]
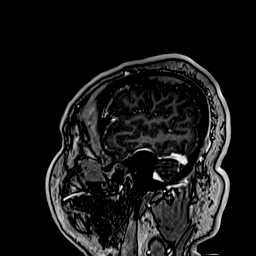
[im 159/175]
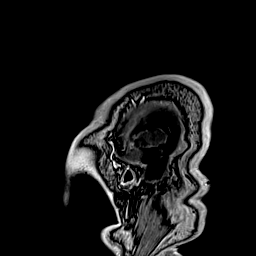
[im 175/175]
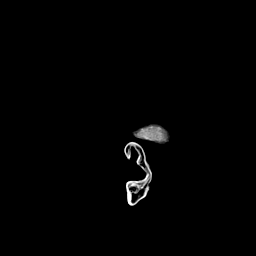

[Series 1078: cor thins rl · coronal · 3.0mm · 0.49mm/px · 7 of 107 slices shown]
[im 1/107]
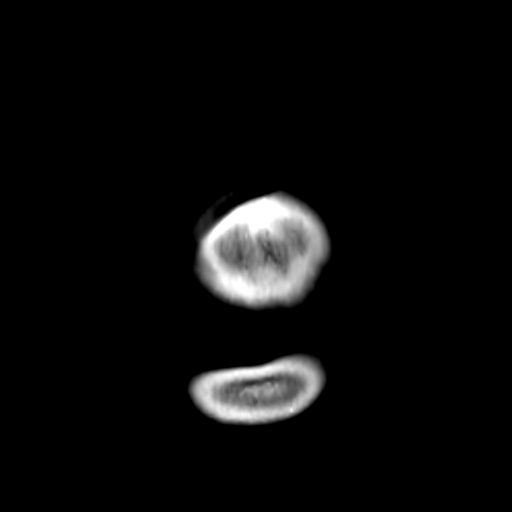
[im 18/107]
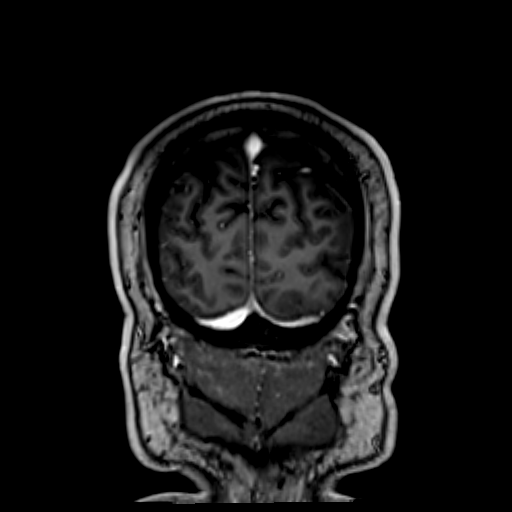
[im 36/107]
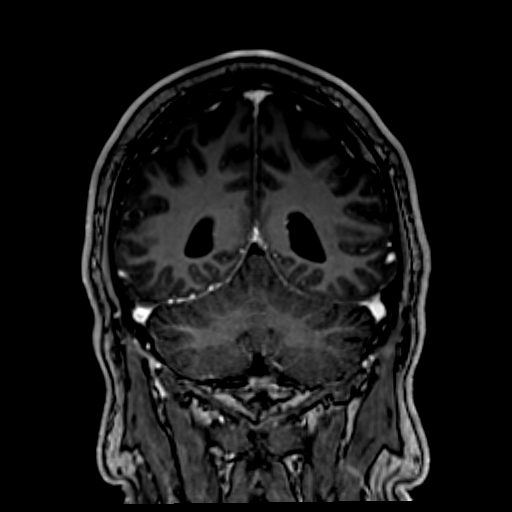
[im 54/107]
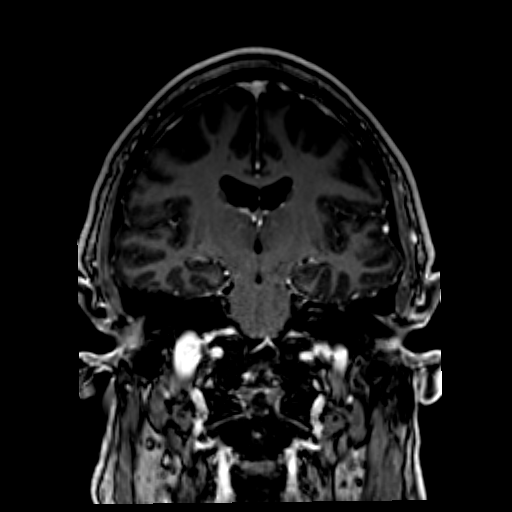
[im 71/107]
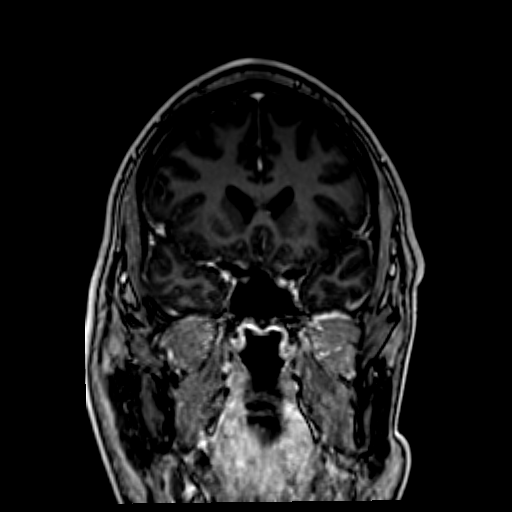
[im 89/107]
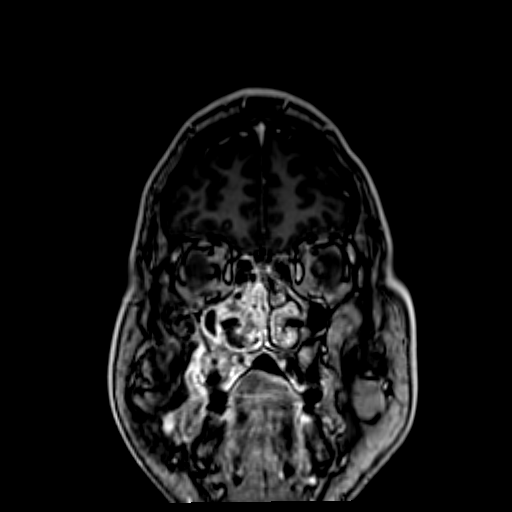
[im 107/107]
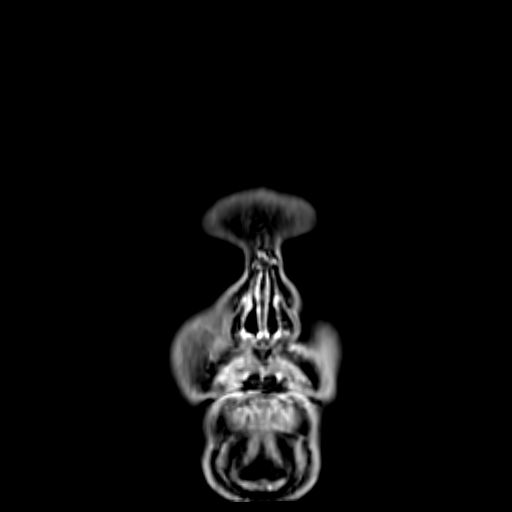

[Series 1084: T2 · axial · 1.0mm · 0.49mm/px · z∈[+13,+183]mm · 9 of 181 slices shown]
[im 1/181]
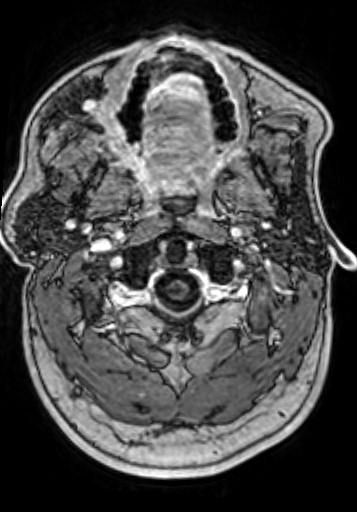
[im 33/181]
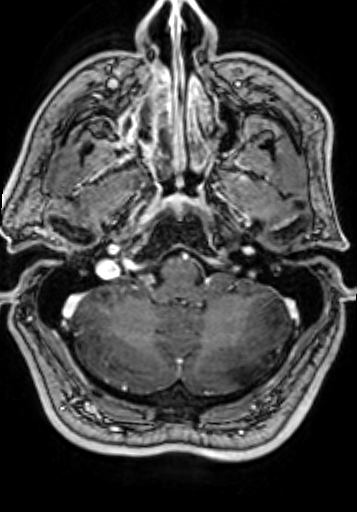
[im 50/181]
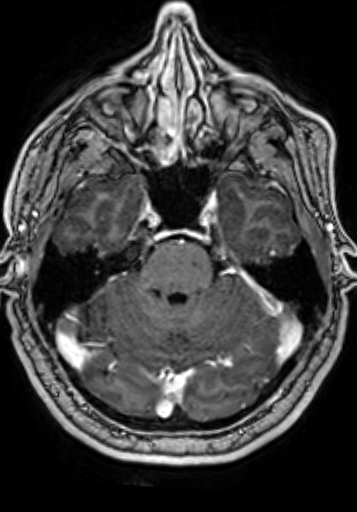
[im 82/181]
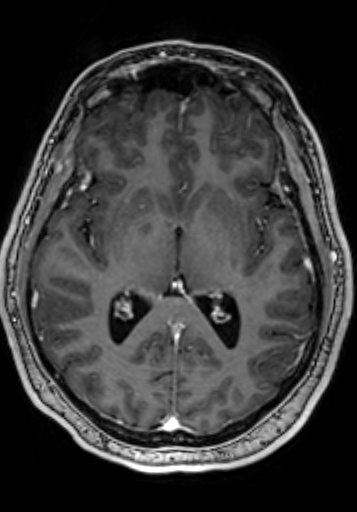
[im 99/181]
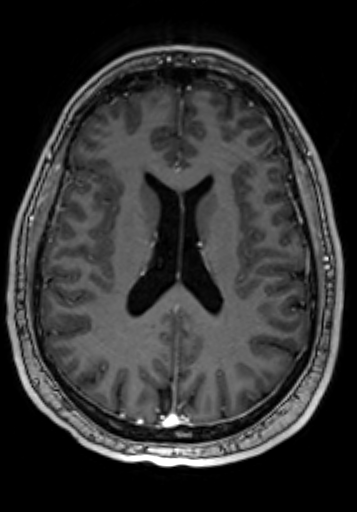
[im 131/181]
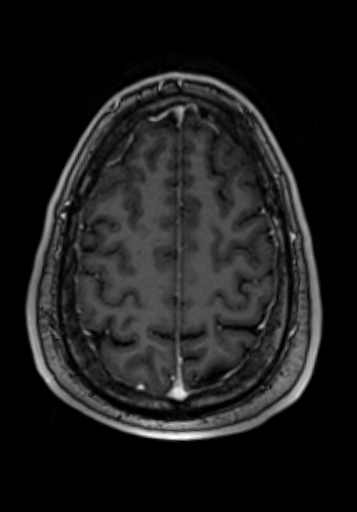
[im 148/181]
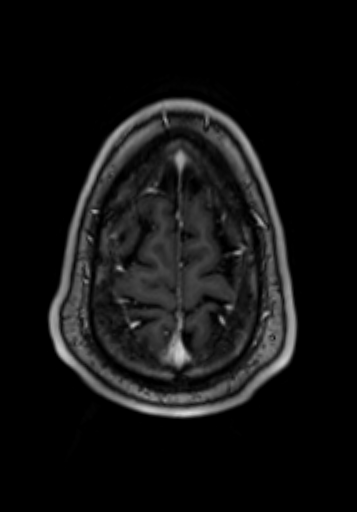
[im 164/181]
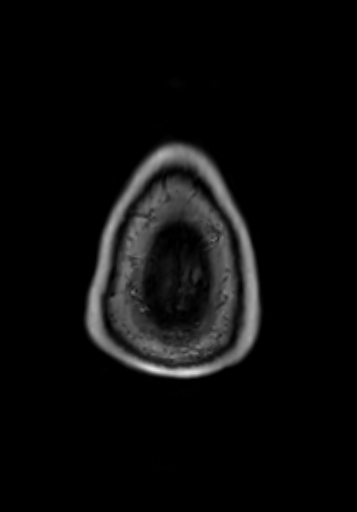
[im 181/181]
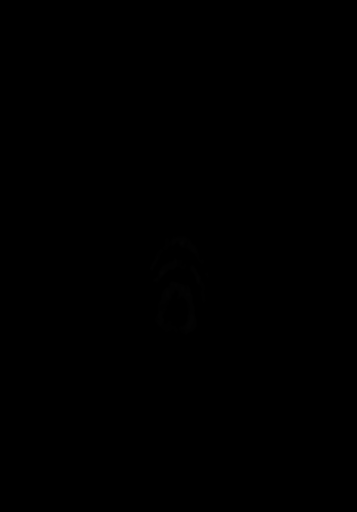

[35 of 48 positions shown; findings below may reference images not displayed]

FINDINGS: MRI HEAD FINDINGS

Brain: No restricted diffusion to suggest acute or subacute infarct.
No acute hemorrhage, mass, mass effect, or midline shift. No
hydrocephalus or extra-axial collection. Cerebral volume is within
normal limits for age.

Vascular: Normal flow voids.

Skull and upper cervical spine: Normal marrow signal.

Sinuses/Orbits: Mucosal thickening in the hypoplastic right
maxillary sinus. Otherwise clear. The orbits are unremarkable.

Other: The mastoids are well aerated.

MRA HEAD FINDINGS

Anterior circulation: Both internal carotid arteries are patent to
the termini, without significant stenosis.

A1 segments patent. Normal anterior communicating artery. Anterior
cerebral arteries are patent to their distal aspects.

No M1 stenosis or occlusion. Normal MCA bifurcations. Distal MCA
branches perfused and symmetric.

Posterior circulation: Vertebral arteries patent to the
vertebrobasilar junction without stenosis. Posterior inferior
cerebral arteries patent bilaterally.

Basilar patent to its distal aspect. Superior cerebellar arteries
patent bilaterally.

Patent P1 segments. PCAs perfused to their distal aspects without
stenosis. The right posterior communicating artery is patent. The
left posterior communicating artery is not visualized.

Anatomic variants: None significant

MRV HEAD FINDINGS

There is no evidence of dural venous sinus or deep cerebral vein
thrombosis. No dural venous sinus stenosis.
IMPRESSION: 1.  No acute intracranial process.
2.  No intracranial large vessel occlusion or significant stenosis.
3. No evidence of dural venous sinus thrombosis or stenosis.

## 2021-03-18 MED ORDER — ONDANSETRON HCL 4 MG/2ML IJ SOLN
4.0000 mg | INTRAMUSCULAR | Status: AC
  Administered 2021-03-18: 4 mg via INTRAVENOUS
  Filled 2021-03-18: qty 2

## 2021-03-18 MED ORDER — HYDROMORPHONE HCL 1 MG/ML IJ SOLN
1.0000 mg | INTRAMUSCULAR | Status: AC
  Administered 2021-03-18: 1 mg via INTRAVENOUS
  Filled 2021-03-18: qty 1

## 2021-03-18 MED ORDER — POTASSIUM CHLORIDE CRYS ER 20 MEQ PO TBCR
40.0000 meq | EXTENDED_RELEASE_TABLET | Freq: Once | ORAL | Status: AC
  Administered 2021-03-18: 40 meq via ORAL
  Filled 2021-03-18: qty 2

## 2021-03-18 MED ORDER — GADOBUTROL 1 MMOL/ML IV SOLN
10.0000 mL | Freq: Once | INTRAVENOUS | Status: AC | PRN
  Administered 2021-03-18: 10 mL via INTRAVENOUS

## 2021-03-18 NOTE — ED Notes (Signed)
In room to DC pt, pt requests staying in ED until he feels more competent to drive after receiving pain medication. Provider and charge rn made aware and ok at this time for pt to stay in room  ?

## 2021-03-18 NOTE — ED Notes (Signed)
Pt to MRI at this time.

## 2021-03-18 NOTE — ED Provider Notes (Signed)
Vitals:  ? 03/18/21 0320 03/18/21 0407  ?BP: (!) 161/92 (!) 151/76  ?Pulse: 79 72  ?Resp: 16 16  ?Temp:  98.4 ?F (36.9 ?C)  ?SpO2: 97% 97%  ? ? ?Patient resting, waiting ride home ?  ?Delman Kitten, MD ?03/18/21 0542 ? ?

## 2021-03-18 NOTE — ED Provider Notes (Signed)
? ?[facility] ?Provider Note ? ? ? Event Date/Time  ? First MD Initiated Contact with Patient 03/18/21 0109   ?  (approximate) ? ? ?History  ? ?Headache ? ? ?HPI ? ?Lonnie Price is a 46 y.o. male history of type 2 diabetes ? ?He reports that he has had an unremitting and worsening right-sided facial headache for about 1 week.  Slowly worsening over time, pain over the right side of the face.  No eye pain.  No change in vision.  No fevers no neck stiffness.  No nausea or vomiting no numbness or weakness. ? ?Took oxycodone this evening prior to coming to the ER, reports no improvement ? ?Reports the pains are progressively worsening having severe pain that is not relieved by even use of oxycodone and also taking antibiotic for about the last 48 hours which she was recently prescribed for right-sided sinusitis ? ?The pain is approximately 10 out of 10 located primarily over the right side of the face.  Some pain seems like it might be coming from inside his mouth and shooting up towards his right cheek as well, reports he has a dental appointment coming up in a week in [place] where he is from ?  ? ? ?Physical Exam  ? ?Triage Vital Signs: ?ED Triage Vitals  ?Enc Vitals Group  ?   BP 03/17/21 2259 (!) 191/118  ?   Pulse Rate 03/17/21 2259 (!) 110  ?   Resp 03/17/21 2259 18  ?   Temp 03/17/21 2259 98.9 ?F (37.2 ?C)  ?   Temp Source 03/17/21 2259 Oral  ?   SpO2 03/17/21 2259 97 %  ?   Weight --   ?   Height --   ?   Head Circumference --   ?   Peak Flow --   ?   Pain Score 03/17/21 2300 8  ?   Pain Loc --   ?   Pain Edu? --   ?   Excl. in [place]? --   ? ? ?Most recent vital signs: ?Vitals:  ? 03/17/21 2259 03/18/21 0320  ?BP: (!) 191/118 (!) 161/92  ?Pulse: (!) 110 79  ?Resp: 18 16  ?Temp: 98.9 ?F (37.2 ?C)   ?SpO2: 97% 97%  ? ? ? ?General: Awake, appears to be in pain.  Holding hand over the right side of his face.  He is fully alert and oriented very pleasant ?CV:  Good peripheral perfusion.  Normal  heart tones no murmur ?Resp:  Normal effort.  Clear lungs bilaterally ?No meningismus.  Full range of motion of neck without pain or discomfort. ?Abd:  No distention.  ?Other:  Ocular.  Extraocular movements are normal.  Conjunctivae are not injected. ?Dental exam, patient has poor dentition multiple posterior teeth, but of note he does report tenderness to palpation along his posterior right upper molars below no abscess edema or swelling is noted, but when pressing in this area he does report that it worsens the pain over the right side of the face and maxillary region. ? ?No temporal artery tenderness ? ?ED Results / Procedures / Treatments  ? ?Labs ?(all labs ordered are listed, but only abnormal results are displayed) ?Labs Reviewed  ?SEDIMENTATION RATE - Abnormal; Notable for the following components:  ?    Result Value  ? Sed Rate 26 (*)   ? All other components within normal limits  ?CBC WITH DIFFERENTIAL/PLATELET - Abnormal; Notable for the following components:  ?  WBC 14.4 (*)   ? Neutro Abs 11.0 (*)   ? Monocytes Absolute 1.3 (*)   ? All other components within normal limits  ?BASIC METABOLIC PANEL - Abnormal; Notable for the following components:  ? Potassium 3.0 (*)   ? CO2 20 (*)   ? Glucose, Bld 155 (*)   ? Calcium 7.7 (*)   ? All other components within normal limits  ? ? ? ?EKG ? ? ? ? ?RADIOLOGY ? ?I personally viewed the patient's MRI of the brain, and I interpret no acute gross abnormality.  Please see the radiologist separate report but also did notes no acute findings ? ? ? ? ? ?PROCEDURES: ? ?Critical Care performed: No ? ?Procedures ? ? ?MEDICATIONS ORDERED IN ED: ?Medications  ?potassium chloride SA (KLOR-CON M) CR tablet 40 mEq (has no administration in time range)  ?HYDROmorphone (DILAUDID) injection 1 mg (1 mg Intravenous Given 03/18/21 0305)  ?ondansetron [facility]) injection 4 mg (4 mg Intravenous Given 03/18/21 0304)  ?gadobutrol (GADAVIST) 1 MMOL/ML injection 10 mL (10 mLs Intravenous  Contrast Given 03/18/21 0225)  ? ? ? ?IMPRESSION / MDM / ASSESSMENT AND PLAN / ED COURSE  ?I reviewed the triage vital signs and the nursing notes. ?             ?               ? ?Differential diagnosis includes, but is not limited to, intracranial hemorrhage, meningitis/encephalitis, previous head trauma, cavernous venous thrombosis, tension headache, temporal arteritis, migraine or migraine equivalent, idiopathic intracranial hypertension, and non-specific headache. ? ?Patient has no infectious symptoms.  His blood pressure is severely elevated, reports that is new but he also appears to be in severe pain with unremitting right-sided headache for 1 week.  Is unclear to me if the headache is driving his hypertension or if the hypertension may be related to his headache.  Based on his clinical history, the unremitting nature of his pain and his physical examination we will proceed with MRI of the brain as well as arterial and venous studies of the brain.  Wish to rule out venous sinus thrombosis, aneurysm, infarct, or other acute neurologic abnormality.  He may have sinusitis but the severity of his pain seems somewhat out of proportion. ? ?He does not have tenderness over the temporal artery.  We will send ESR though pretest probability for temporal arteritis is low.  Also the right-sided nature and severity of the pain and unremitting nature and he reports a tearing feeling in his right eye could be suggestive of cluster headache.  Trialing 100% nonrebreather for 15 minutes to possible therapeutic.  He has no symptoms of infection and is afebrile.  No meningismus.  No clear infectious findings suggest encephalitis or meningitis but does have probable sinusitis but is currently being treated with antibiotic for that. ? ? ? ?The patient is on the cardiac monitor to evaluate for evidence of arrhythmia and/or significant heart rate changes. ? ?Labs reviewed and personally reviewed by me and notable for mild  leukocytosis, in addition mild hypokalemia.  Potassium will be repleted.  Leukocytosis I suspect is likely secondary to known sinusitis.  Again no evidence that would support meningitis or encephalitis by clinical history and exam.  ESR mildly elevated, but not high enough to be consistent with acute arteritis/giant cell and his clinical picture does not fit that ? ?----------------------------------------- ?4:06 AM on 03/18/2021 ?----------------------------------------- ?Patient reports pain markedly improved.  Resting comfortably at this time.  Fully alert and oriented.  He did receive hydromorphone earlier, he is agreeable to waiting additional time prior to discharge.  I did instruct him also not to drive his car this morning, he acknowledges this. ? ?Return precautions and treatment recommendations and follow-up discussed with the patient who is agreeable with the plan. ? ?  ? ? ?FINAL CLINICAL IMPRESSION(S) / ED DIAGNOSES  ? ?Final diagnoses:  ?Acute maxillary sinusitis, recurrence not specified  ? ? ? ?Rx / DC Orders  ? ?ED Discharge Orders   ? ? None  ? ?  ? ? ? ?Note:  This document was prepared using Dragon voice recognition software and may include unintentional dictation errors. ?  Delman Kitten, MD ?03/18/21 0408 ? ?

## 2021-03-18 NOTE — Discharge Instructions (Addendum)
Please continue your current medications as previously prescribed ? ?No driving this morning, no driving while using oxycodone. ?
